# Patient Record
Sex: Female | Born: 1950
Health system: Southern US, Community
[De-identification: ages and names within clinical notes are randomized; demographics above are authoritative.]

## PROBLEM LIST (undated history)

## (undated) DIAGNOSIS — F32A Depression, unspecified: Secondary | ICD-10-CM

## (undated) DIAGNOSIS — F329 Major depressive disorder, single episode, unspecified: Secondary | ICD-10-CM

## (undated) DIAGNOSIS — G43909 Migraine, unspecified, not intractable, without status migrainosus: Secondary | ICD-10-CM

## (undated) HISTORY — PX: OTHER SURGICAL HISTORY: SHX169

## (undated) HISTORY — PX: BREAST SURGERY: SHX581

## (undated) HISTORY — DX: Depression, unspecified: F32.A

## (undated) HISTORY — DX: Major depressive disorder, single episode, unspecified: F32.9

## (undated) HISTORY — PX: FRACTURE SURGERY: SHX138

## (undated) HISTORY — PX: CHOLECYSTECTOMY: SHX55

## (undated) HISTORY — DX: Migraine, unspecified, not intractable, without status migrainosus: G43.909

## (undated) HISTORY — PX: SHOULDER SURGERY: SHX246

## (undated) HISTORY — PX: CARPAL TUNNEL RELEASE: SHX101

---

## 1998-07-16 ENCOUNTER — Encounter: Payer: Self-pay | Admitting: Emergency Medicine

## 1998-07-16 ENCOUNTER — Emergency Department (HOSPITAL_COMMUNITY): Admission: EM | Admit: 1998-07-16 | Discharge: 1998-07-16 | Payer: Self-pay | Admitting: Emergency Medicine

## 2000-01-24 ENCOUNTER — Ambulatory Visit (HOSPITAL_BASED_OUTPATIENT_CLINIC_OR_DEPARTMENT_OTHER): Admission: RE | Admit: 2000-01-24 | Discharge: 2000-01-24 | Payer: Self-pay | Admitting: Plastic Surgery

## 2000-05-29 ENCOUNTER — Ambulatory Visit (HOSPITAL_BASED_OUTPATIENT_CLINIC_OR_DEPARTMENT_OTHER): Admission: RE | Admit: 2000-05-29 | Discharge: 2000-05-29 | Payer: Self-pay | Admitting: Plastic Surgery

## 2000-12-10 ENCOUNTER — Encounter: Admission: RE | Admit: 2000-12-10 | Discharge: 2001-01-01 | Payer: Self-pay | Admitting: Internal Medicine

## 2001-01-12 ENCOUNTER — Other Ambulatory Visit: Admission: RE | Admit: 2001-01-12 | Discharge: 2001-01-12 | Payer: Self-pay | Admitting: Internal Medicine

## 2001-01-31 ENCOUNTER — Encounter: Payer: Self-pay | Admitting: Emergency Medicine

## 2001-01-31 ENCOUNTER — Emergency Department (HOSPITAL_COMMUNITY): Admission: EM | Admit: 2001-01-31 | Discharge: 2001-01-31 | Payer: Self-pay | Admitting: Emergency Medicine

## 2001-09-08 ENCOUNTER — Encounter: Admission: RE | Admit: 2001-09-08 | Discharge: 2001-12-07 | Payer: Self-pay | Admitting: Internal Medicine

## 2001-12-04 ENCOUNTER — Encounter: Payer: Self-pay | Admitting: Internal Medicine

## 2001-12-04 ENCOUNTER — Encounter: Admission: RE | Admit: 2001-12-04 | Discharge: 2001-12-04 | Payer: Self-pay | Admitting: Internal Medicine

## 2001-12-10 ENCOUNTER — Encounter: Payer: Self-pay | Admitting: Internal Medicine

## 2001-12-10 ENCOUNTER — Encounter: Admission: RE | Admit: 2001-12-10 | Discharge: 2001-12-10 | Payer: Self-pay | Admitting: Internal Medicine

## 2002-01-14 ENCOUNTER — Other Ambulatory Visit: Admission: RE | Admit: 2002-01-14 | Discharge: 2002-01-14 | Payer: Self-pay | Admitting: Internal Medicine

## 2003-12-28 ENCOUNTER — Encounter: Admission: RE | Admit: 2003-12-28 | Discharge: 2003-12-28 | Payer: Self-pay | Admitting: Internal Medicine

## 2005-10-16 ENCOUNTER — Encounter: Admission: RE | Admit: 2005-10-16 | Discharge: 2005-10-16 | Payer: Self-pay | Admitting: Obstetrics and Gynecology

## 2006-07-30 ENCOUNTER — Encounter: Admission: RE | Admit: 2006-07-30 | Discharge: 2006-07-30 | Payer: Self-pay | Admitting: Internal Medicine

## 2007-11-12 ENCOUNTER — Encounter: Admission: RE | Admit: 2007-11-12 | Discharge: 2007-11-12 | Payer: Self-pay | Admitting: Obstetrics and Gynecology

## 2008-12-06 ENCOUNTER — Encounter: Admission: RE | Admit: 2008-12-06 | Discharge: 2008-12-06 | Payer: Self-pay | Admitting: Obstetrics and Gynecology

## 2009-08-16 ENCOUNTER — Encounter: Admission: RE | Admit: 2009-08-16 | Discharge: 2009-08-16 | Payer: Self-pay | Admitting: Obstetrics and Gynecology

## 2009-11-16 ENCOUNTER — Ambulatory Visit (HOSPITAL_BASED_OUTPATIENT_CLINIC_OR_DEPARTMENT_OTHER): Admission: RE | Admit: 2009-11-16 | Discharge: 2009-11-16 | Payer: Self-pay | Admitting: Orthopedic Surgery

## 2009-12-29 ENCOUNTER — Encounter: Admission: RE | Admit: 2009-12-29 | Discharge: 2009-12-29 | Payer: Self-pay | Admitting: Obstetrics and Gynecology

## 2010-01-03 ENCOUNTER — Encounter: Admission: RE | Admit: 2010-01-03 | Discharge: 2010-01-03 | Payer: Self-pay | Admitting: Obstetrics and Gynecology

## 2010-10-19 ENCOUNTER — Ambulatory Visit
Admission: RE | Admit: 2010-10-19 | Discharge: 2010-10-19 | Payer: Self-pay | Source: Home / Self Care | Attending: Orthopedic Surgery | Admitting: Orthopedic Surgery

## 2010-10-22 LAB — POCT HEMOGLOBIN-HEMACUE: Hemoglobin: 12.5 g/dL (ref 12.0–15.0)

## 2010-10-27 ENCOUNTER — Encounter: Payer: Self-pay | Admitting: Internal Medicine

## 2010-10-28 ENCOUNTER — Encounter: Payer: Self-pay | Admitting: Obstetrics and Gynecology

## 2010-12-27 LAB — POCT HEMOGLOBIN-HEMACUE: Hemoglobin: 12.9 g/dL (ref 12.0–15.0)

## 2011-01-07 ENCOUNTER — Other Ambulatory Visit: Payer: Self-pay | Admitting: Obstetrics and Gynecology

## 2011-01-07 DIAGNOSIS — Z1231 Encounter for screening mammogram for malignant neoplasm of breast: Secondary | ICD-10-CM

## 2011-01-07 DIAGNOSIS — N951 Menopausal and female climacteric states: Secondary | ICD-10-CM

## 2011-01-07 DIAGNOSIS — M8580 Other specified disorders of bone density and structure, unspecified site: Secondary | ICD-10-CM

## 2011-01-10 ENCOUNTER — Ambulatory Visit: Payer: Self-pay

## 2011-01-11 ENCOUNTER — Inpatient Hospital Stay: Admission: RE | Admit: 2011-01-11 | Payer: Self-pay | Source: Ambulatory Visit

## 2011-01-11 ENCOUNTER — Ambulatory Visit: Payer: Self-pay

## 2011-01-18 ENCOUNTER — Ambulatory Visit
Admission: RE | Admit: 2011-01-18 | Discharge: 2011-01-18 | Disposition: A | Discharge status: Home / Self Care | Payer: 59 | Source: Ambulatory Visit | Attending: Obstetrics and Gynecology | Admitting: Obstetrics and Gynecology

## 2011-01-18 DIAGNOSIS — M8580 Other specified disorders of bone density and structure, unspecified site: Secondary | ICD-10-CM

## 2011-01-18 DIAGNOSIS — N951 Menopausal and female climacteric states: Secondary | ICD-10-CM

## 2011-01-18 DIAGNOSIS — Z1231 Encounter for screening mammogram for malignant neoplasm of breast: Secondary | ICD-10-CM

## 2011-09-26 ENCOUNTER — Other Ambulatory Visit (HOSPITAL_COMMUNITY): Payer: Self-pay | Admitting: Gastroenterology

## 2011-09-26 DIAGNOSIS — K635 Polyp of colon: Secondary | ICD-10-CM

## 2011-10-03 ENCOUNTER — Other Ambulatory Visit (HOSPITAL_COMMUNITY): Payer: 59

## 2011-10-14 ENCOUNTER — Ambulatory Visit (HOSPITAL_COMMUNITY)
Admission: RE | Admit: 2011-10-14 | Discharge: 2011-10-14 | Disposition: A | Discharge status: Home / Self Care | Payer: 59 | Source: Ambulatory Visit | Attending: Gastroenterology | Admitting: Gastroenterology

## 2011-10-14 DIAGNOSIS — K635 Polyp of colon: Secondary | ICD-10-CM

## 2011-10-14 DIAGNOSIS — D126 Benign neoplasm of colon, unspecified: Secondary | ICD-10-CM | POA: Insufficient documentation

## 2011-10-14 DIAGNOSIS — I998 Other disorder of circulatory system: Secondary | ICD-10-CM | POA: Insufficient documentation

## 2011-12-30 ENCOUNTER — Other Ambulatory Visit: Payer: Self-pay | Admitting: Obstetrics and Gynecology

## 2011-12-30 DIAGNOSIS — Z1231 Encounter for screening mammogram for malignant neoplasm of breast: Secondary | ICD-10-CM

## 2012-01-22 ENCOUNTER — Ambulatory Visit
Admission: RE | Admit: 2012-01-22 | Discharge: 2012-01-22 | Disposition: A | Discharge status: Home / Self Care | Payer: 59 | Source: Ambulatory Visit | Attending: Obstetrics and Gynecology | Admitting: Obstetrics and Gynecology

## 2012-01-22 DIAGNOSIS — Z1231 Encounter for screening mammogram for malignant neoplasm of breast: Secondary | ICD-10-CM

## 2012-01-27 ENCOUNTER — Other Ambulatory Visit: Payer: Self-pay | Admitting: Obstetrics and Gynecology

## 2012-01-27 DIAGNOSIS — R928 Other abnormal and inconclusive findings on diagnostic imaging of breast: Secondary | ICD-10-CM

## 2012-01-28 ENCOUNTER — Other Ambulatory Visit: Payer: Self-pay | Admitting: Obstetrics and Gynecology

## 2012-01-28 ENCOUNTER — Ambulatory Visit
Admission: RE | Admit: 2012-01-28 | Discharge: 2012-01-28 | Disposition: A | Discharge status: Home / Self Care | Payer: 59 | Source: Ambulatory Visit | Attending: Obstetrics and Gynecology | Admitting: Obstetrics and Gynecology

## 2012-01-28 DIAGNOSIS — R928 Other abnormal and inconclusive findings on diagnostic imaging of breast: Secondary | ICD-10-CM

## 2013-01-20 IMAGING — CR DG BE W/ CM - WO/W KUB
8 of 9 series · 8 of 9 positions shown · IV contrast (agent unspecified)
Comparison: None.

CLINICAL DATA: Incomplete colonoscopy.

BE WITH CONTRAST - WITHOUT AND WITH KUB

[view not recorded (1 of 8)]
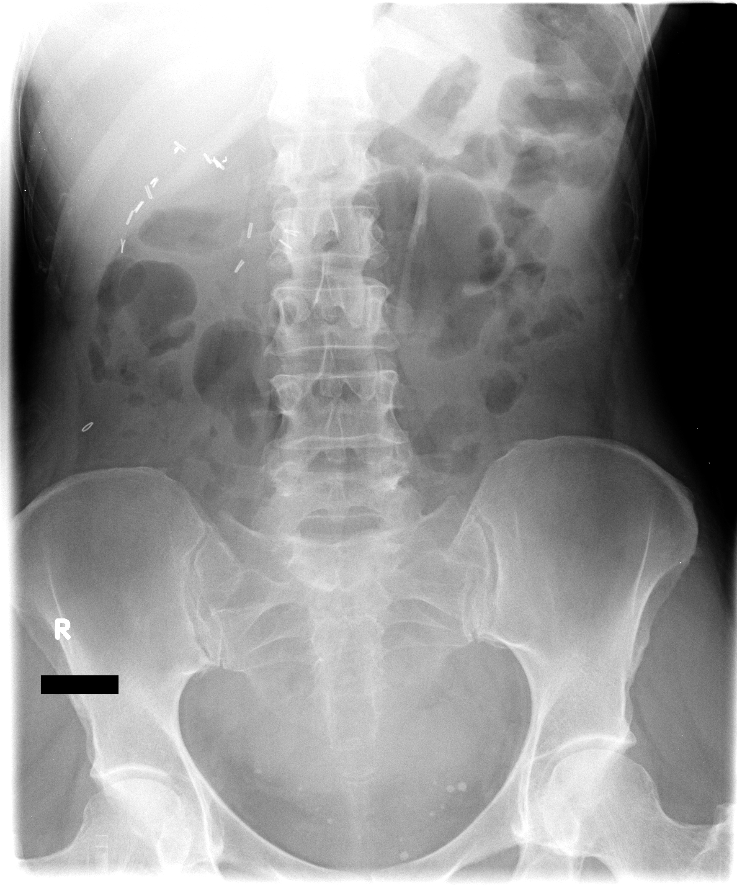

[view not recorded (2 of 8)]
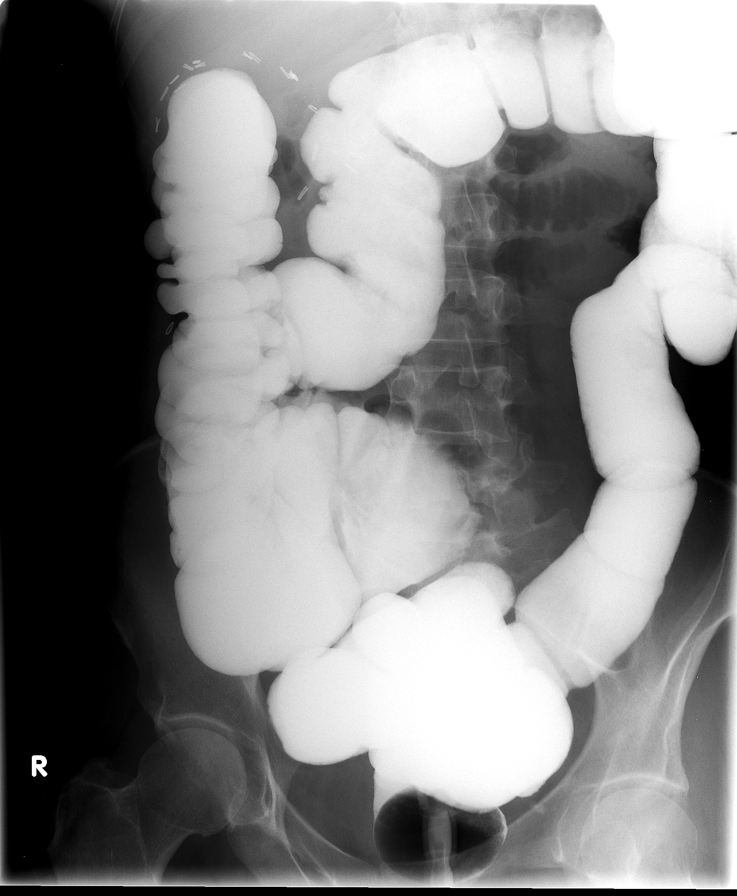

[view not recorded (3 of 8)]
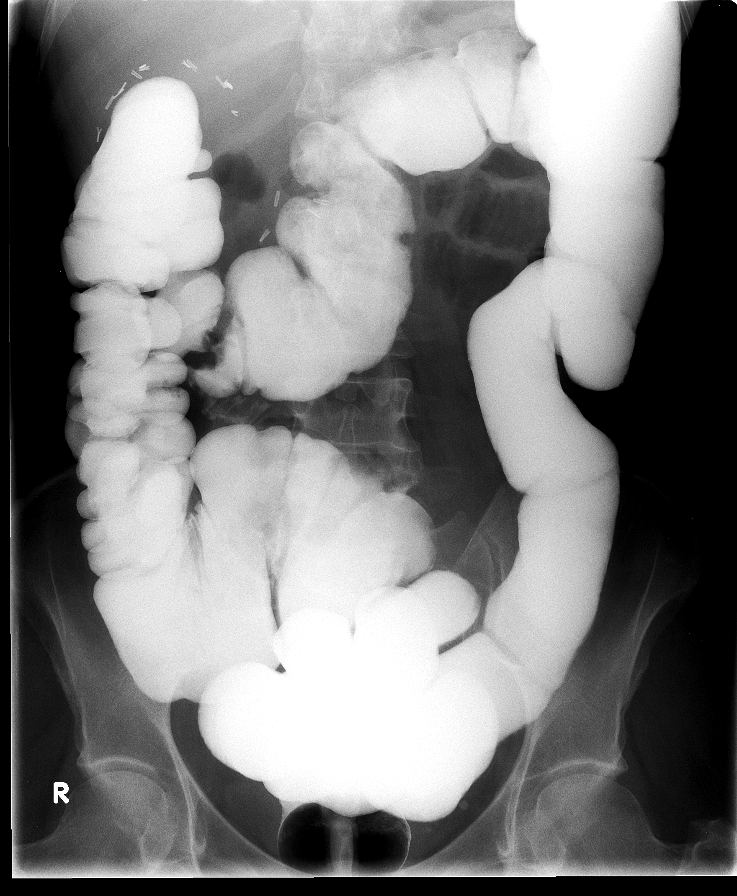

[view not recorded (4 of 8)]
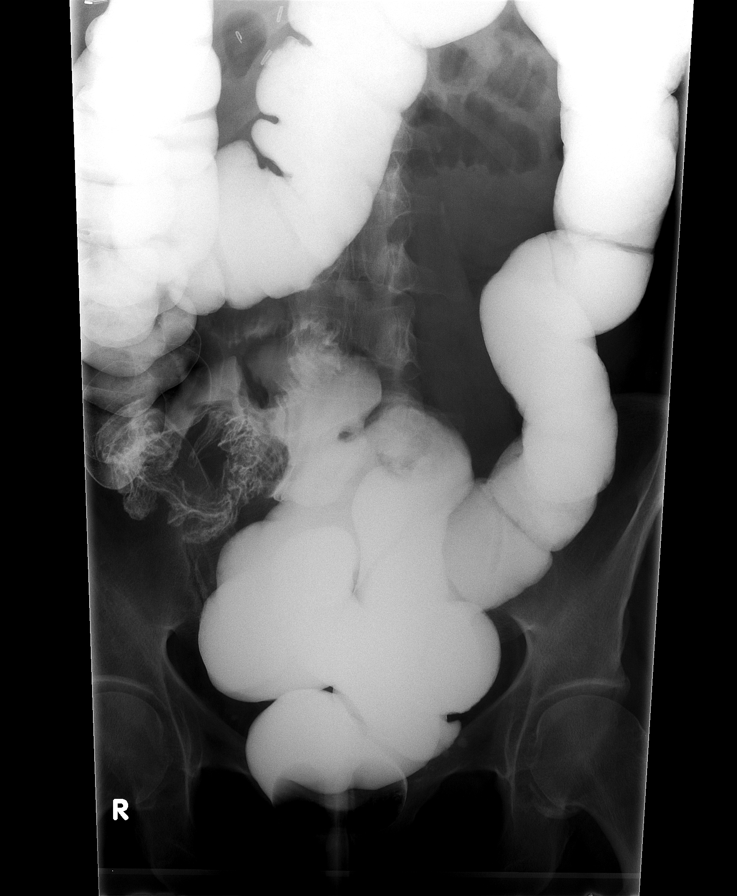

[view not recorded (5 of 8)]
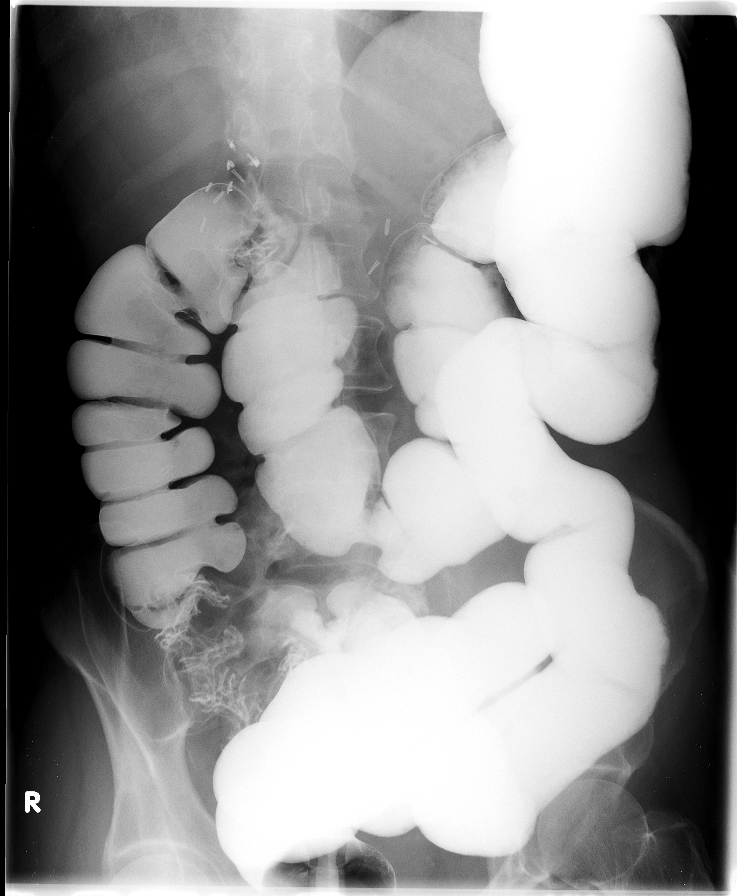

[view not recorded (6 of 8)]
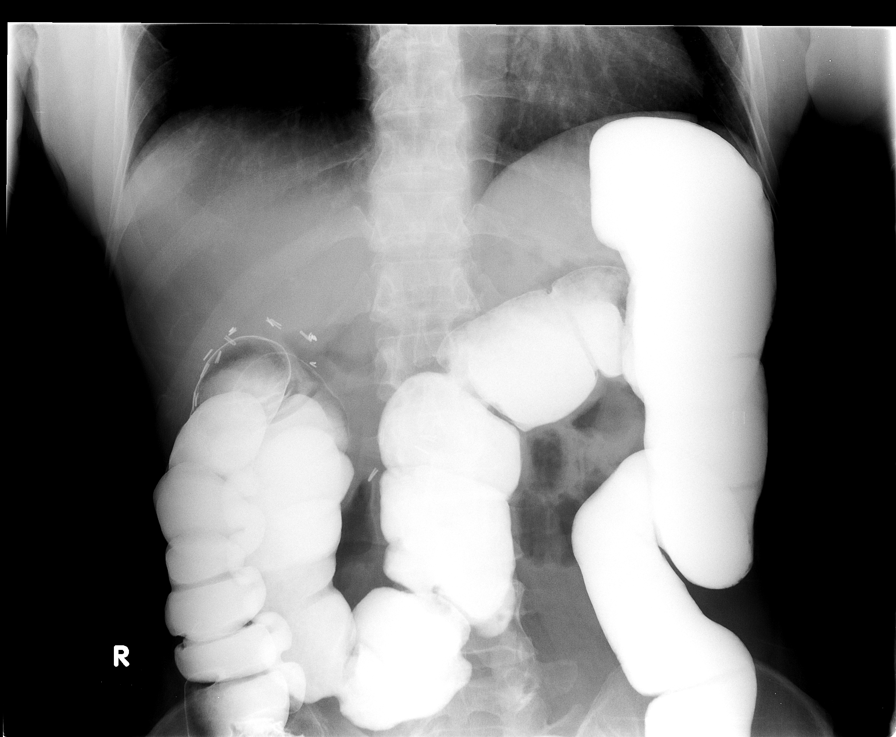

[view not recorded (7 of 8)]
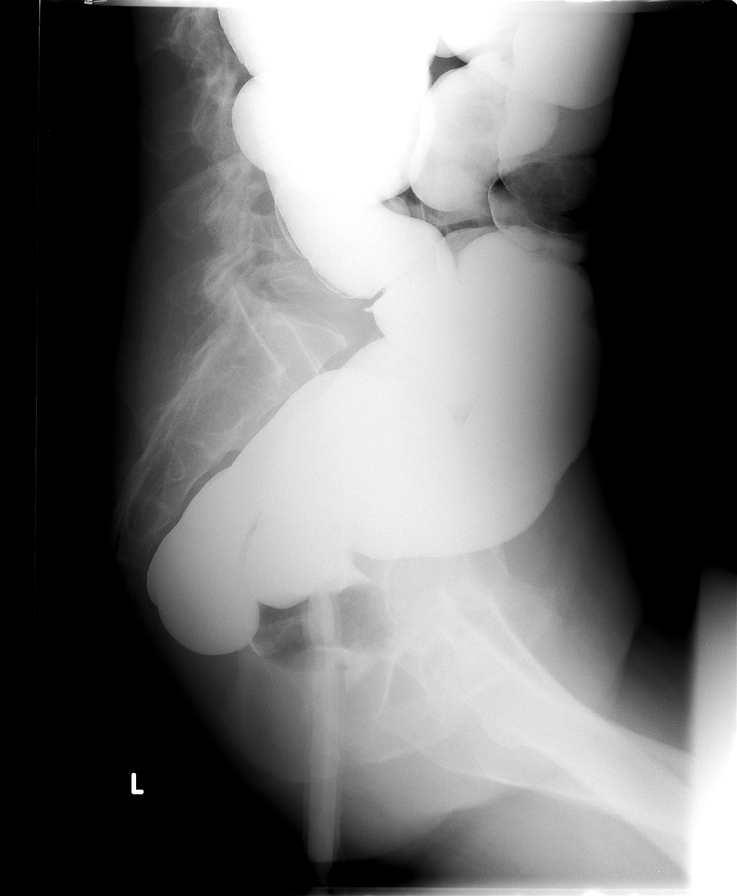

[view not recorded (8 of 8)]
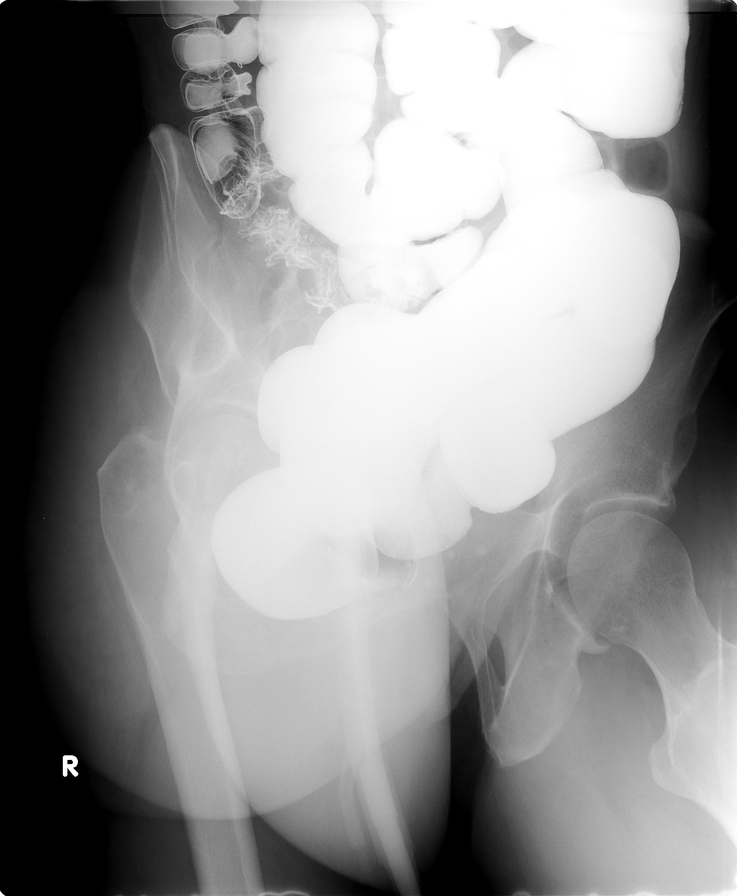

[8 of 9 positions shown; findings below may reference images not displayed]

FINDINGS: Scout film of the abdomen shows a normal bowel gas
pattern.  Clips in the right upper quadrant presumably from prior
cholecystectomy.  Calcified phleboliths in the anatomic pelvis.  No
bony abnormality.

Single contrast barium enema demonstrates no old polypoid filling
defects, fixed strictures or visible masses.
IMPRESSION: Unremarkable study.

## 2013-01-26 ENCOUNTER — Other Ambulatory Visit: Payer: Self-pay

## 2013-01-26 DIAGNOSIS — Z9882 Breast implant status: Secondary | ICD-10-CM

## 2013-01-26 DIAGNOSIS — Z1231 Encounter for screening mammogram for malignant neoplasm of breast: Secondary | ICD-10-CM

## 2013-02-23 ENCOUNTER — Ambulatory Visit
Admission: RE | Admit: 2013-02-23 | Discharge: 2013-02-23 | Disposition: A | Discharge status: Home / Self Care | Payer: 59 | Source: Ambulatory Visit

## 2013-02-23 DIAGNOSIS — Z1231 Encounter for screening mammogram for malignant neoplasm of breast: Secondary | ICD-10-CM

## 2013-02-23 DIAGNOSIS — Z9882 Breast implant status: Secondary | ICD-10-CM

## 2013-12-08 ENCOUNTER — Encounter (HOSPITAL_COMMUNITY): Payer: Self-pay | Admitting: Emergency Medicine

## 2013-12-08 ENCOUNTER — Emergency Department (HOSPITAL_COMMUNITY): Payer: Managed Care, Other (non HMO)

## 2013-12-08 ENCOUNTER — Emergency Department (HOSPITAL_COMMUNITY)
Admission: EM | Admit: 2013-12-08 | Discharge: 2013-12-08 | Disposition: A | Discharge status: Home / Self Care | Payer: Managed Care, Other (non HMO) | Attending: Emergency Medicine | Admitting: Emergency Medicine

## 2013-12-08 DIAGNOSIS — Z87891 Personal history of nicotine dependence: Secondary | ICD-10-CM | POA: Insufficient documentation

## 2013-12-08 DIAGNOSIS — Z79899 Other long term (current) drug therapy: Secondary | ICD-10-CM | POA: Insufficient documentation

## 2013-12-08 DIAGNOSIS — E860 Dehydration: Secondary | ICD-10-CM | POA: Insufficient documentation

## 2013-12-08 DIAGNOSIS — R1110 Vomiting, unspecified: Secondary | ICD-10-CM

## 2013-12-08 DIAGNOSIS — R61 Generalized hyperhidrosis: Secondary | ICD-10-CM | POA: Insufficient documentation

## 2013-12-08 DIAGNOSIS — R112 Nausea with vomiting, unspecified: Secondary | ICD-10-CM | POA: Insufficient documentation

## 2013-12-08 DIAGNOSIS — R0789 Other chest pain: Secondary | ICD-10-CM | POA: Insufficient documentation

## 2013-12-08 LAB — CBC WITH DIFFERENTIAL/PLATELET
BASOS ABS: 0 10*3/uL (ref 0.0–0.1)
BASOS PCT: 1 % (ref 0–1)
Eosinophils Absolute: 0.1 10*3/uL (ref 0.0–0.7)
Eosinophils Relative: 2 % (ref 0–5)
HEMATOCRIT: 35.8 % — AB (ref 36.0–46.0)
Hemoglobin: 12.8 g/dL (ref 12.0–15.0)
Lymphocytes Relative: 36 % (ref 12–46)
Lymphs Abs: 2 10*3/uL (ref 0.7–4.0)
MCH: 33.6 pg (ref 26.0–34.0)
MCHC: 35.8 g/dL (ref 30.0–36.0)
MCV: 94 fL (ref 78.0–100.0)
MONO ABS: 0.5 10*3/uL (ref 0.1–1.0)
Monocytes Relative: 8 % (ref 3–12)
NEUTROS ABS: 3.1 10*3/uL (ref 1.7–7.7)
Neutrophils Relative %: 54 % (ref 43–77)
Platelets: 206 10*3/uL (ref 150–400)
RBC: 3.81 MIL/uL — ABNORMAL LOW (ref 3.87–5.11)
RDW: 13.2 % (ref 11.5–15.5)
WBC: 5.7 10*3/uL (ref 4.0–10.5)

## 2013-12-08 LAB — COMPREHENSIVE METABOLIC PANEL
ALBUMIN: 4.2 g/dL (ref 3.5–5.2)
ALT: 21 U/L (ref 0–35)
AST: 47 U/L — AB (ref 0–37)
Alkaline Phosphatase: 56 U/L (ref 39–117)
BILIRUBIN TOTAL: 0.4 mg/dL (ref 0.3–1.2)
BUN: 15 mg/dL (ref 6–23)
CALCIUM: 9.7 mg/dL (ref 8.4–10.5)
CHLORIDE: 100 meq/L (ref 96–112)
CO2: 18 mEq/L — ABNORMAL LOW (ref 19–32)
CREATININE: 0.6 mg/dL (ref 0.50–1.10)
GFR calc Af Amer: >90 mL/min (ref >90)
GFR calc non Af Amer: >90 mL/min (ref >90)
Glucose, Bld: 128 mg/dL — ABNORMAL HIGH (ref 70–99)
Potassium: 4.7 mEq/L (ref 3.7–5.3)
Sodium: 138 mEq/L (ref 137–147)
TOTAL PROTEIN: 7.9 g/dL (ref 6.0–8.3)

## 2013-12-08 LAB — I-STAT TROPONIN, ED
TROPONIN I, POC: 0 ng/mL (ref 0.00–0.08)
TROPONIN I, POC: 0 ng/mL (ref 0.00–0.08)

## 2013-12-08 LAB — LIPASE, BLOOD: Lipase: 36 U/L (ref 11–59)

## 2013-12-08 LAB — ETHANOL

## 2013-12-08 MED ORDER — SODIUM CHLORIDE 0.9 % IV BOLUS (SEPSIS)
1000.0000 mL | Freq: Once | INTRAVENOUS | Status: AC
  Administered 2013-12-08: 1000 mL via INTRAVENOUS

## 2013-12-08 MED ORDER — ONDANSETRON HCL 4 MG/2ML IJ SOLN
4.0000 mg | Freq: Once | INTRAMUSCULAR | Status: AC
  Administered 2013-12-08: 4 mg via INTRAVENOUS
  Filled 2013-12-08: qty 2

## 2013-12-08 MED ORDER — LORAZEPAM 2 MG/ML IJ SOLN
1.0000 mg | Freq: Once | INTRAMUSCULAR | Status: AC
  Administered 2013-12-08: 1 mg via INTRAVENOUS
  Filled 2013-12-08: qty 1

## 2013-12-08 MED ORDER — ONDANSETRON 4 MG PO TBDP: ORAL_TABLET | ORAL | Status: DC

## 2013-12-08 NOTE — ED Notes (Signed)
Bed: WA20 Expected date:  Expected time:  Means of arrival:  Comments: ems 

## 2013-12-08 NOTE — ED Notes (Signed)
Pt from work with c/o n/v since this AM.  Actively vomiting per EMS, improved after zofran.  Skin PWD.  A+Ox4.  Zofran 4mg  IVP x2.

## 2013-12-08 NOTE — ED Notes (Signed)
To radiology

## 2013-12-08 NOTE — ED Notes (Signed)
Pt resting comfortably, no active vomiting, denies nausea at this time.  Family at bs.  NAD.

## 2013-12-08 NOTE — Discharge Instructions (Signed)
Stay hydrated.   Take zofran for nausea.   Follow up with your doctor.   Return to ER if you have severe pain, vomiting, fever, chest pain.

## 2013-12-08 NOTE — ED Notes (Signed)
Upon arrival to ED. Pt c/o of chest tightness 3/10, reported started when arrived to ED

## 2013-12-08 NOTE — ED Provider Notes (Signed)
CSN: [id number]     Arrival date & time 12/08/13  1256 History   First MD Initiated Contact with Patient 12/08/13 1257     Chief Complaint  Patient presents with  . Nausea  . Emesis  . chest tightness      (Consider location/radiation/quality/duration/timing/severity/associated sxs/prior Treatment) The history is provided by the patient.  NAKEISHA GREENHOUSE is a 63 y.o. female hx of breast surgery here with chest pain, nausea. She was on the phone this morning and started having some nausea and diaphoresis. She was given Zofran by EMS and felt slightly better but now worse. Has some chest pressure as well. Denies any fevers or chills or diarrhea. Denies history of CAD or family history of CAD. She is former smoker.    History reviewed. No pertinent past medical history. Past Surgical History  Procedure Laterality Date  . Broken leg      1970 right leg surgery  . Breast surgery      augmentation 1983  . Nose job      2012  . Cholecystectomy      1984  . Shoulder surgery      right 2000  . Carpal tunnel release      right and left wrist   History reviewed. No pertinent family history. History  Substance Use Topics  . Smoking status: Former Smoker -- 1.00 packs/day for 15 years    Types: Cigarettes  . Smokeless tobacco: Not on file  . Alcohol Use: Yes     Comment: 2-3 glasses wine daily   OB History   Grav Para Term Preterm Abortions TAB SAB Ect Mult Living                 Review of Systems  Cardiovascular: Positive for chest pain.  Gastrointestinal: Positive for vomiting.  All other systems reviewed and are negative.      Allergies  Codeine  Home Medications   Current Outpatient Rx  Name  Route  Sig  Dispense  Refill  . BIOTIN 5000 PO   Oral   Take 1 tablet by mouth daily.         . cholecalciferol (VITAMIN D) 1000 UNITS tablet   Oral   Take 1,000 Units by mouth daily.         . Escitalopram Oxalate (LEXAPRO PO)   Oral   Take 1 tablet by mouth  daily.         . Multiple Vitamin (MULTIVITAMIN WITH MINERALS) TABS tablet   Oral   Take 1 tablet by mouth daily.         . Probiotic Product (PROBIOTIC DAILY PO)   Oral   Take 2 capsules by mouth every morning.         . propranolol (INDERAL) 20 MG tablet   Oral   Take 20 mg by mouth as needed (stage fright).         . tetrahydrozoline 0.05 % ophthalmic solution   Both Eyes   Place 1 drop into both eyes 2 (two) times daily as needed (eye irritation).         . zaleplon (SONATA) 10 MG capsule   Oral   Take 10 mg by mouth at bedtime as needed for sleep.         Marland Kitchen zolmitriptan (ZOMIG) 5 MG tablet   Oral   Take 5 mg by mouth as needed for migraine.          BP 148/71  Pulse  65  Temp(Src) 97.6 F (36.4 C) (Oral)  Resp 16  SpO2 100% Physical Exam  Nursing note and vitals reviewed. Constitutional: She is oriented to person, place, and time.  Uncomfortable, vomiting   HENT:  Head: Normocephalic.  Mouth/Throat: Oropharynx is clear and moist.  Eyes: Conjunctivae are normal. Pupils are equal, round, and reactive to light.  Neck: Normal range of motion. Neck supple.  Cardiovascular: Normal rate and regular rhythm.   Pulmonary/Chest: Effort normal and breath sounds normal. No respiratory distress. She has no wheezes. She has no rales.  Abdominal: Soft. Bowel sounds are normal. She exhibits no distension. There is no tenderness. There is no rebound and no guarding.  Musculoskeletal: Normal range of motion. She exhibits no edema and no tenderness.  Neurological: She is alert and oriented to person, place, and time. No cranial nerve deficit. Coordination normal.  Skin: Skin is warm and dry.  Psychiatric: She has a normal mood and affect. Her behavior is normal. Judgment and thought content normal.    ED Course  Procedures (including critical care time) Labs Review Labs Reviewed  CBC WITH DIFFERENTIAL - Abnormal; Notable for the following:    RBC 3.81 (*)    HCT  35.8 (*)    All other components within normal limits  COMPREHENSIVE METABOLIC PANEL - Abnormal; Notable for the following:    CO2 18 (*)    Glucose, Bld 128 (*)    AST 47 (*)    All other components within normal limits  LIPASE, BLOOD  ETHANOL  I-STAT TROPOININ, ED  Randolm Idol, ED   Imaging Review Dg Chest 2 View  12/08/2013   CLINICAL DATA:  Chest pain.  EXAM: CHEST  2 VIEW  COMPARISON:  None.  FINDINGS: Lungs are clear. No pneumothorax or pleural effusion. Calcified breast implants noted. No focal bony abnormality.  IMPRESSION: No acute disease.   Electronically Signed   By: Inge Rise M.D.   On: 12/08/2013 14:16     EKG Interpretation   Date/Time:  Wednesday December 08 2013 12:57:34 EST Ventricular Rate:  63 PR Interval:  183 QRS Duration: 107 QT Interval:  488 QTC Calculation: 500 R Axis:   -29 Text Interpretation:  Sinus rhythm Borderline left axis deviation  Borderline prolonged QT interval nonspecific TW changes improved since  2011 Confirmed by Meygan Kyser  MD, Eilish Mcdaniel ([id number]) on 12/08/2013 1:25:09 PM      MDM   Final diagnoses:  None   ALVERA TOURIGNY is a 63 y.o. female here with nausea, chest pain. Consider gastritis vs atypical ACS. Will get labs, trop x 2, cxr. Will reassess.   3:21 PM CXR unremarkable. Labs showed CO2 18, likely from vomiting. Given zofran and IVF and was able to tolerate PO. Likely early gastroenteritis. Will d/c home on zofran prn.    Wandra Arthurs, MD 12/08/13 [phone]

## 2014-01-06 ENCOUNTER — Ambulatory Visit (INDEPENDENT_AMBULATORY_CARE_PROVIDER_SITE_OTHER): Payer: Managed Care, Other (non HMO) | Admitting: Family Medicine

## 2014-01-06 ENCOUNTER — Ambulatory Visit: Payer: Managed Care, Other (non HMO)

## 2014-01-06 VITALS — BP 136/82 | HR 76 | Temp 98.2°F | Resp 17 | Ht 69.5 in | Wt 146.0 lb

## 2014-01-06 DIAGNOSIS — W108XXA Fall (on) (from) other stairs and steps, initial encounter: Secondary | ICD-10-CM

## 2014-01-06 DIAGNOSIS — M25569 Pain in unspecified knee: Secondary | ICD-10-CM

## 2014-01-06 DIAGNOSIS — M25561 Pain in right knee: Secondary | ICD-10-CM

## 2014-01-06 MED ORDER — NAPROXEN 500 MG PO TBEC: 500.0000 mg | DELAYED_RELEASE_TABLET | Freq: Two times a day (BID) | ORAL | Status: DC

## 2014-01-06 NOTE — Patient Instructions (Signed)
Your xrays are normal today, which is good!  I suspect this is a sprain/strain due to your fall  Take the Naprosyn twice daily with food for the next 1-2 weeks  Ice and elevated the knee frequently over the next 48 hours  If the pain is worsening or if it is not improving in about 2 weeks, please let us know   RICE: Routine Care for Injuries The routine care of many injuries includes Rest, Ice, Compression, and Elevation (RICE). HOME CARE INSTRUCTIONS  Rest is needed to allow your body to heal. Routine activities can usually be resumed when comfortable. Injured tendons and bones can take up to 6 weeks to heal. Tendons are the cord-like structures that attach muscle to bone.  Ice following an injury helps keep the swelling down and reduces pain.  Put ice in a plastic bag.  Place a towel between your skin and the bag.  Leave the ice on for 15-20 minutes, 03-04 times a day. Do this while awake, for the first 24 to 48 hours. After that, continue as directed by your caregiver.  Compression helps keep swelling down. It also gives support and helps with discomfort. If an elastic bandage has been applied, it should be removed and reapplied every 3 to 4 hours. It should not be applied tightly, but firmly enough to keep swelling down. Watch fingers or toes for swelling, bluish discoloration, coldness, numbness, or excessive pain. If any of these problems occur, remove the bandage and reapply loosely. Contact your caregiver if these problems continue.  Elevation helps reduce swelling and decreases pain. With extremities, such as the arms, hands, legs, and feet, the injured area should be placed near or above the level of the heart, if possible. SEEK IMMEDIATE MEDICAL CARE IF:  You have persistent pain and swelling.  You develop redness, numbness, or unexpected weakness.  Your symptoms are getting worse rather than improving after several days. These symptoms may indicate that further evaluation  or further X-rays are needed. Sometimes, X-rays may not show a small broken bone (fracture) until 1 week or 10 days later. Make a follow-up appointment with your caregiver. Ask when your X-ray results will be ready. Make sure you get your X-ray results. Document Released: 01/05/2001 Document Revised: 12/16/2011 Document Reviewed: 02/22/2011 [facility] Patient Information 2014 [place], [place].

## 2014-01-06 NOTE — Progress Notes (Signed)
   Subjective:    Patient ID: Tracy Mendez, female    DOB: May 06, 1951, 63 y.o.   MRN: [id number]  HPI   Ms. Tracy Mendez is a very pleasant 63 yr old female here with concern for RIGHT knee pain.  She states she was carrying a heavy box into the house with her husband last night - she was going backwards up the steps - when she lost her balance and fell.  Unsure how she landed but subsequently developed pain in the right knee.  She feels that the knee is slightly swollen compared with the left.  No bruising.  Pain is at the anterior aspect of the knee.  Pain is worst when flexing the knee.  She is able to bear weight but does not want to flex the knee while walking.  She has not taken anything for pain thus far.  She is not iced or elevated.  She reports previous right knee pain after hiking a few years ago - saw [facility] and pain spontaneously resolved with rest.   Review of Systems  Constitutional: Negative.   Respiratory: Negative.   Cardiovascular: Negative.   Musculoskeletal: Positive for arthralgias, gait problem and joint swelling.  Skin: Negative.   Neurological: Negative for weakness and numbness.       Objective:   Physical Exam  Vitals reviewed. Constitutional: She is oriented to person, place, and time. She appears well-developed and well-nourished. No distress.  HENT:  Head: Normocephalic and atraumatic.  Cardiovascular: Intact distal pulses.   Pulmonary/Chest: Effort normal.  Musculoskeletal:       Right knee: She exhibits decreased range of motion and swelling (slight compared with the left). She exhibits no effusion, no ecchymosis, no deformity, no erythema, no LCL laxity, no bony tenderness and no MCL laxity. No medial joint line, no lateral joint line, no MCL and no LCL tenderness noted.       Legs: Pain with flexion of knee  Neurological: She is alert and oriented to person, place, and time.  Skin: Skin is warm and dry.  Psychiatric: She has a normal mood and affect.  Her behavior is normal.      [facility] reading (PRIMARY) by  Dr. Nyoka Cowden - minimal DJD at the medial joint space; no acute abnormality      Assessment & Plan:  Right anterior knee pain - Plan: DG Knee Complete 4 Views Right  Fall down steps - Plan: DG Knee Complete 4 Views Right   Ms. Tracy Mendez is a very pleasant 63 yr old female here with RIGHT knee pain after a fall last night.  Xrays are negative for fracture.  There is no tenderness to palpation on exam, but she has pain over the anterior aspect of the knee with flexion.  There is no laxity and no evidence of meniscal injury today.  Suspect sprain/strain secondary to her fall.  Will treat with Naprosyn BID x 1-2 weeks.  Encouraged frequent icing and elevation over the next 48 hours.  Placed in a knee brace for extra support.  We discussed RTC precautions including worsening symptoms or no improvement in 2 wks.  Pt to call or RTC if worsening or not improving  E. Natividad Brood MHS, PA-C [facility] 4/2/20157:23 PM

## 2014-01-07 NOTE — Progress Notes (Signed)
Xray read and patient discussed with Tracy Mendez. Agree with assessment and plan of care per her note.   

## 2014-07-22 ENCOUNTER — Other Ambulatory Visit: Payer: Self-pay

## 2015-03-17 IMAGING — CR DG CHEST 2V
2 series · 2 of 2 positions shown · non-contrast
Comparison: None.

CLINICAL DATA: Chest pain.

EXAM:
CHEST  2 VIEW

[w chest pa]
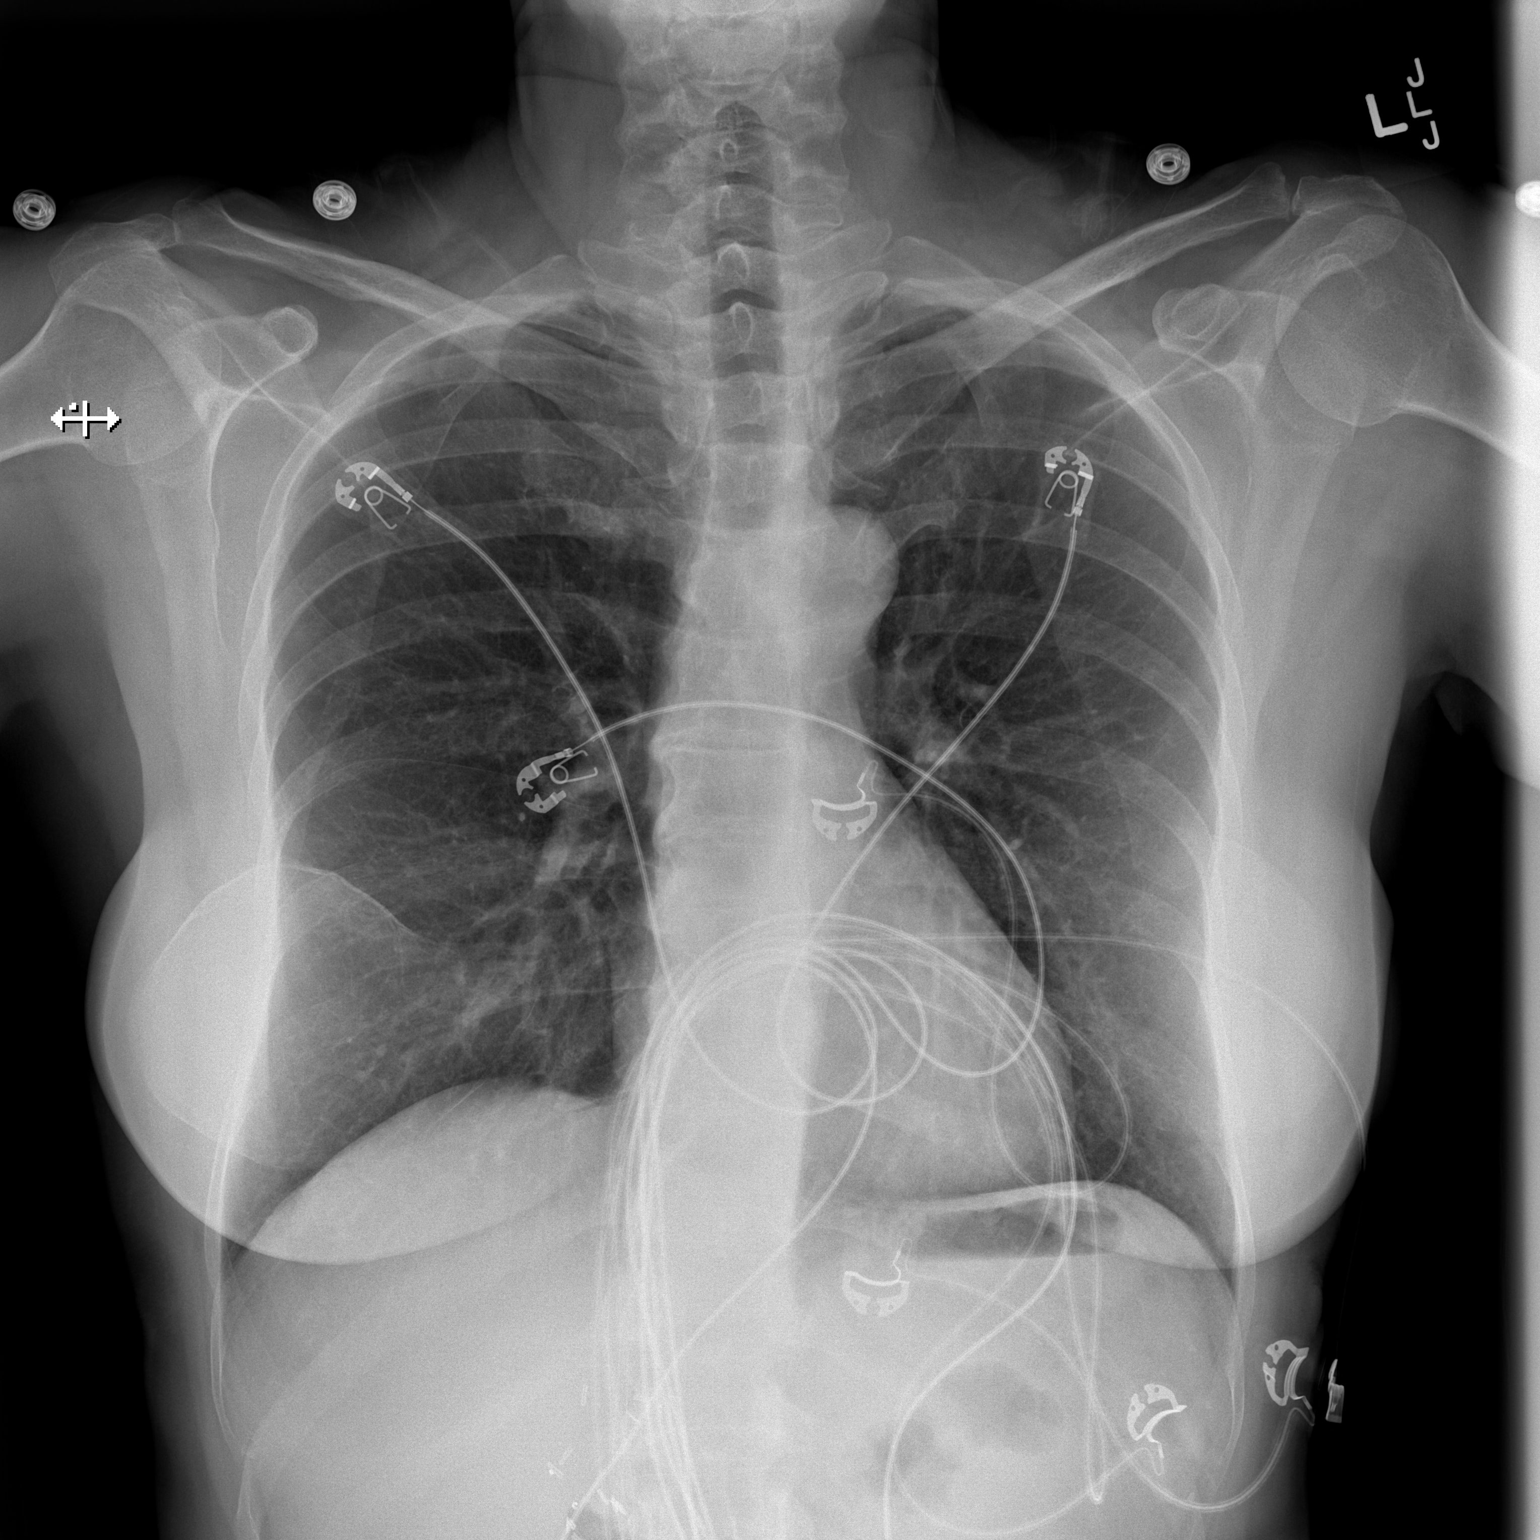

[w chest lat]
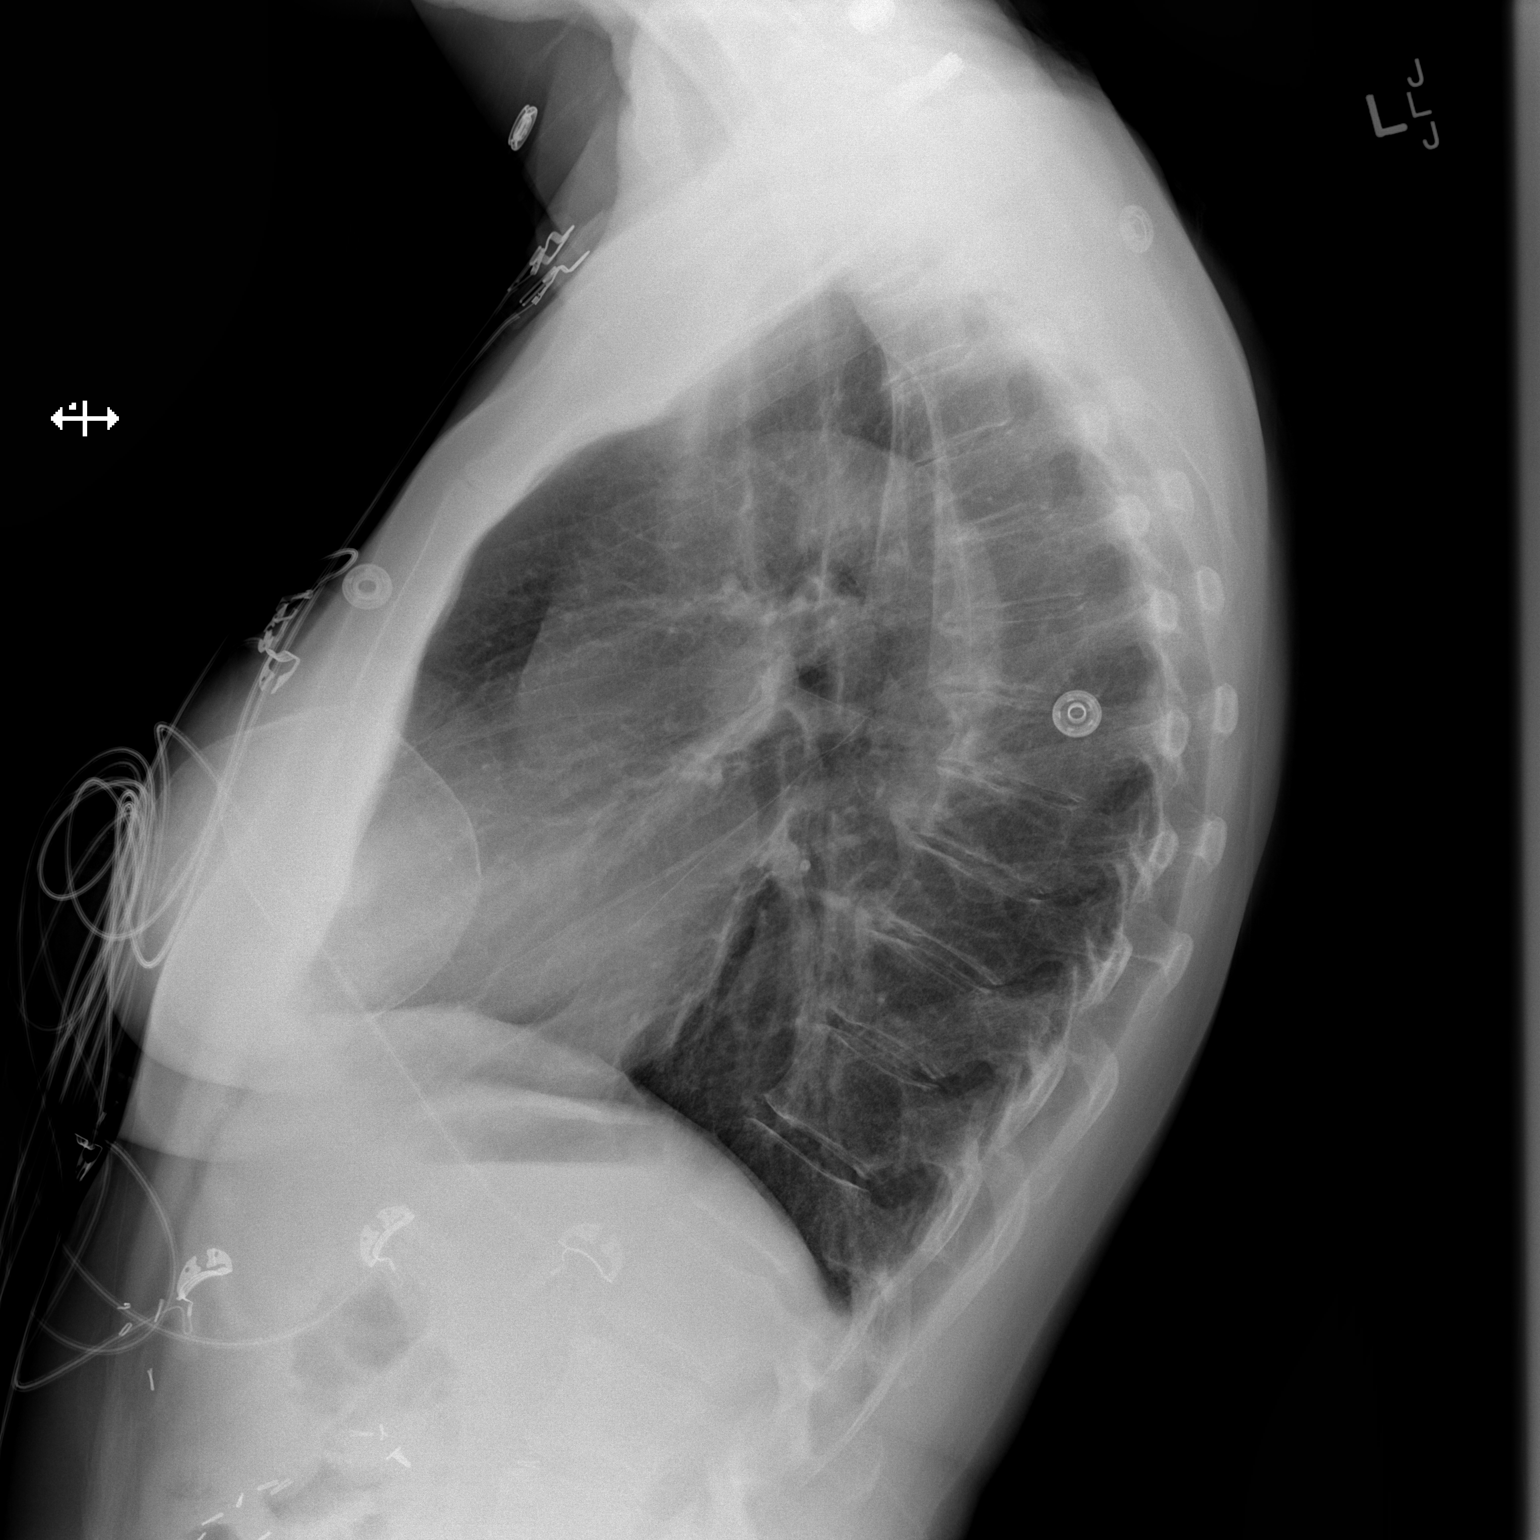

[2 of 2 positions shown; findings below may reference images not displayed]

FINDINGS: Lungs are clear. No pneumothorax or pleural effusion. Calcified
breast implants noted. No focal bony abnormality.
IMPRESSION: No acute disease.

## 2016-04-11 ENCOUNTER — Other Ambulatory Visit: Payer: Self-pay | Admitting: Obstetrics

## 2016-04-11 DIAGNOSIS — E2839 Other primary ovarian failure: Secondary | ICD-10-CM

## 2016-04-22 ENCOUNTER — Ambulatory Visit
Admission: RE | Admit: 2016-04-22 | Discharge: 2016-04-22 | Disposition: A | Discharge status: Home / Self Care | Payer: 59 | Source: Ambulatory Visit | Attending: Obstetrics | Admitting: Obstetrics

## 2016-04-22 DIAGNOSIS — E2839 Other primary ovarian failure: Secondary | ICD-10-CM

## 2016-08-01 DIAGNOSIS — Z23 Encounter for immunization: Secondary | ICD-10-CM | POA: Diagnosis not present

## 2016-08-28 DIAGNOSIS — H40023 Open angle with borderline findings, high risk, bilateral: Secondary | ICD-10-CM | POA: Diagnosis not present

## 2016-09-21 DIAGNOSIS — R69 Illness, unspecified: Secondary | ICD-10-CM | POA: Diagnosis not present

## 2016-09-21 DIAGNOSIS — Z0000 Encounter for general adult medical examination without abnormal findings: Secondary | ICD-10-CM | POA: Diagnosis not present

## 2016-09-21 DIAGNOSIS — G43919 Migraine, unspecified, intractable, without status migrainosus: Secondary | ICD-10-CM | POA: Diagnosis not present

## 2017-02-26 DIAGNOSIS — H11153 Pinguecula, bilateral: Secondary | ICD-10-CM | POA: Diagnosis not present

## 2017-02-26 DIAGNOSIS — H02401 Unspecified ptosis of right eyelid: Secondary | ICD-10-CM | POA: Diagnosis not present

## 2017-02-26 DIAGNOSIS — H2513 Age-related nuclear cataract, bilateral: Secondary | ICD-10-CM | POA: Diagnosis not present

## 2017-02-26 DIAGNOSIS — H40023 Open angle with borderline findings, high risk, bilateral: Secondary | ICD-10-CM | POA: Diagnosis not present

## 2017-05-08 DIAGNOSIS — Z85828 Personal history of other malignant neoplasm of skin: Secondary | ICD-10-CM | POA: Diagnosis not present

## 2017-05-08 DIAGNOSIS — D224 Melanocytic nevi of scalp and neck: Secondary | ICD-10-CM | POA: Diagnosis not present

## 2017-05-08 DIAGNOSIS — D225 Melanocytic nevi of trunk: Secondary | ICD-10-CM | POA: Diagnosis not present

## 2017-05-08 DIAGNOSIS — D2230 Melanocytic nevi of unspecified part of face: Secondary | ICD-10-CM | POA: Diagnosis not present

## 2017-05-08 DIAGNOSIS — L570 Actinic keratosis: Secondary | ICD-10-CM | POA: Diagnosis not present

## 2017-05-19 DIAGNOSIS — J018 Other acute sinusitis: Secondary | ICD-10-CM | POA: Diagnosis not present

## 2017-06-10 DIAGNOSIS — E559 Vitamin D deficiency, unspecified: Secondary | ICD-10-CM | POA: Diagnosis not present

## 2017-06-10 DIAGNOSIS — R69 Illness, unspecified: Secondary | ICD-10-CM | POA: Diagnosis not present

## 2017-06-10 DIAGNOSIS — G43109 Migraine with aura, not intractable, without status migrainosus: Secondary | ICD-10-CM | POA: Diagnosis not present

## 2017-06-10 DIAGNOSIS — Z79899 Other long term (current) drug therapy: Secondary | ICD-10-CM | POA: Diagnosis not present

## 2017-06-10 DIAGNOSIS — M65331 Trigger finger, right middle finger: Secondary | ICD-10-CM | POA: Diagnosis not present

## 2017-06-10 DIAGNOSIS — Z136 Encounter for screening for cardiovascular disorders: Secondary | ICD-10-CM | POA: Diagnosis not present

## 2017-06-10 DIAGNOSIS — G4700 Insomnia, unspecified: Secondary | ICD-10-CM | POA: Diagnosis not present

## 2017-06-10 DIAGNOSIS — Z0001 Encounter for general adult medical examination with abnormal findings: Secondary | ICD-10-CM | POA: Diagnosis not present

## 2017-07-10 DIAGNOSIS — F341 Dysthymic disorder: Secondary | ICD-10-CM | POA: Diagnosis not present

## 2017-07-10 DIAGNOSIS — Z23 Encounter for immunization: Secondary | ICD-10-CM | POA: Diagnosis not present

## 2017-07-10 DIAGNOSIS — F411 Generalized anxiety disorder: Secondary | ICD-10-CM | POA: Diagnosis not present

## 2017-07-10 DIAGNOSIS — R69 Illness, unspecified: Secondary | ICD-10-CM | POA: Diagnosis not present

## 2017-09-02 DIAGNOSIS — H40023 Open angle with borderline findings, high risk, bilateral: Secondary | ICD-10-CM | POA: Diagnosis not present

## 2017-09-02 DIAGNOSIS — H11153 Pinguecula, bilateral: Secondary | ICD-10-CM | POA: Diagnosis not present

## 2017-09-02 DIAGNOSIS — H2513 Age-related nuclear cataract, bilateral: Secondary | ICD-10-CM | POA: Diagnosis not present

## 2017-09-02 DIAGNOSIS — H02401 Unspecified ptosis of right eyelid: Secondary | ICD-10-CM | POA: Diagnosis not present

## 2017-09-02 DIAGNOSIS — H524 Presbyopia: Secondary | ICD-10-CM | POA: Diagnosis not present

## 2017-09-02 DIAGNOSIS — H52223 Regular astigmatism, bilateral: Secondary | ICD-10-CM | POA: Diagnosis not present

## 2018-01-01 DIAGNOSIS — R69 Illness, unspecified: Secondary | ICD-10-CM | POA: Diagnosis not present

## 2018-03-24 DIAGNOSIS — H40023 Open angle with borderline findings, high risk, bilateral: Secondary | ICD-10-CM | POA: Diagnosis not present

## 2018-03-24 DIAGNOSIS — H11823 Conjunctivochalasis, bilateral: Secondary | ICD-10-CM | POA: Diagnosis not present

## 2018-03-24 DIAGNOSIS — H2513 Age-related nuclear cataract, bilateral: Secondary | ICD-10-CM | POA: Diagnosis not present

## 2018-04-07 DIAGNOSIS — R69 Illness, unspecified: Secondary | ICD-10-CM | POA: Diagnosis not present

## 2018-05-18 DIAGNOSIS — L259 Unspecified contact dermatitis, unspecified cause: Secondary | ICD-10-CM | POA: Diagnosis not present

## 2018-05-18 DIAGNOSIS — Z85828 Personal history of other malignant neoplasm of skin: Secondary | ICD-10-CM | POA: Diagnosis not present

## 2018-05-18 DIAGNOSIS — L814 Other melanin hyperpigmentation: Secondary | ICD-10-CM | POA: Diagnosis not present

## 2018-05-18 DIAGNOSIS — D225 Melanocytic nevi of trunk: Secondary | ICD-10-CM | POA: Diagnosis not present

## 2018-05-18 DIAGNOSIS — D1801 Hemangioma of skin and subcutaneous tissue: Secondary | ICD-10-CM | POA: Diagnosis not present

## 2018-05-18 DIAGNOSIS — L821 Other seborrheic keratosis: Secondary | ICD-10-CM | POA: Diagnosis not present

## 2018-06-24 DIAGNOSIS — R69 Illness, unspecified: Secondary | ICD-10-CM | POA: Diagnosis not present

## 2018-06-24 DIAGNOSIS — Z79899 Other long term (current) drug therapy: Secondary | ICD-10-CM | POA: Diagnosis not present

## 2018-06-24 DIAGNOSIS — H6092 Unspecified otitis externa, left ear: Secondary | ICD-10-CM | POA: Diagnosis not present

## 2018-06-24 DIAGNOSIS — G4700 Insomnia, unspecified: Secondary | ICD-10-CM | POA: Diagnosis not present

## 2018-06-24 DIAGNOSIS — E559 Vitamin D deficiency, unspecified: Secondary | ICD-10-CM | POA: Diagnosis not present

## 2018-06-24 DIAGNOSIS — Z0001 Encounter for general adult medical examination with abnormal findings: Secondary | ICD-10-CM | POA: Diagnosis not present

## 2018-06-24 DIAGNOSIS — Z1389 Encounter for screening for other disorder: Secondary | ICD-10-CM | POA: Diagnosis not present

## 2018-06-24 DIAGNOSIS — G43109 Migraine with aura, not intractable, without status migrainosus: Secondary | ICD-10-CM | POA: Diagnosis not present

## 2018-06-24 DIAGNOSIS — Z23 Encounter for immunization: Secondary | ICD-10-CM | POA: Diagnosis not present

## 2018-07-01 DIAGNOSIS — Z1231 Encounter for screening mammogram for malignant neoplasm of breast: Secondary | ICD-10-CM | POA: Diagnosis not present

## 2018-07-14 DIAGNOSIS — F411 Generalized anxiety disorder: Secondary | ICD-10-CM | POA: Diagnosis not present

## 2018-07-14 DIAGNOSIS — R69 Illness, unspecified: Secondary | ICD-10-CM | POA: Diagnosis not present

## 2018-08-10 DIAGNOSIS — Z01419 Encounter for gynecological examination (general) (routine) without abnormal findings: Secondary | ICD-10-CM | POA: Diagnosis not present

## 2018-08-11 ENCOUNTER — Other Ambulatory Visit: Payer: Self-pay | Admitting: Obstetrics

## 2018-08-11 DIAGNOSIS — M8580 Other specified disorders of bone density and structure, unspecified site: Secondary | ICD-10-CM

## 2018-08-13 DIAGNOSIS — R69 Illness, unspecified: Secondary | ICD-10-CM | POA: Diagnosis not present

## 2018-09-01 DIAGNOSIS — R509 Fever, unspecified: Secondary | ICD-10-CM | POA: Diagnosis not present

## 2018-09-01 DIAGNOSIS — R05 Cough: Secondary | ICD-10-CM | POA: Diagnosis not present

## 2018-09-01 DIAGNOSIS — J209 Acute bronchitis, unspecified: Secondary | ICD-10-CM | POA: Diagnosis not present

## 2018-09-02 DIAGNOSIS — J209 Acute bronchitis, unspecified: Secondary | ICD-10-CM | POA: Diagnosis not present

## 2018-09-15 ENCOUNTER — Ambulatory Visit
Admission: RE | Admit: 2018-09-15 | Discharge: 2018-09-15 | Disposition: A | Discharge status: Home / Self Care | Payer: Medicare HMO | Source: Ambulatory Visit | Attending: Obstetrics | Admitting: Obstetrics

## 2018-09-15 DIAGNOSIS — M8589 Other specified disorders of bone density and structure, multiple sites: Secondary | ICD-10-CM | POA: Diagnosis not present

## 2018-09-15 DIAGNOSIS — Z780 Asymptomatic menopausal state: Secondary | ICD-10-CM | POA: Diagnosis not present

## 2018-09-15 DIAGNOSIS — M8580 Other specified disorders of bone density and structure, unspecified site: Secondary | ICD-10-CM

## 2018-09-24 DIAGNOSIS — J209 Acute bronchitis, unspecified: Secondary | ICD-10-CM | POA: Diagnosis not present

## 2018-10-02 ENCOUNTER — Ambulatory Visit
Admission: RE | Admit: 2018-10-02 | Discharge: 2018-10-02 | Disposition: A | Discharge status: Home / Self Care | Payer: Medicare HMO | Source: Ambulatory Visit | Attending: Internal Medicine | Admitting: Internal Medicine

## 2018-10-02 ENCOUNTER — Other Ambulatory Visit: Payer: Self-pay | Admitting: Internal Medicine

## 2018-10-02 DIAGNOSIS — R05 Cough: Secondary | ICD-10-CM

## 2018-10-02 DIAGNOSIS — R059 Cough, unspecified: Secondary | ICD-10-CM

## 2018-10-13 DIAGNOSIS — H2513 Age-related nuclear cataract, bilateral: Secondary | ICD-10-CM | POA: Diagnosis not present

## 2018-10-13 DIAGNOSIS — H02401 Unspecified ptosis of right eyelid: Secondary | ICD-10-CM | POA: Diagnosis not present

## 2018-10-13 DIAGNOSIS — H40023 Open angle with borderline findings, high risk, bilateral: Secondary | ICD-10-CM | POA: Diagnosis not present

## 2018-10-13 DIAGNOSIS — H11153 Pinguecula, bilateral: Secondary | ICD-10-CM | POA: Diagnosis not present

## 2018-11-10 DIAGNOSIS — H9202 Otalgia, left ear: Secondary | ICD-10-CM | POA: Diagnosis not present

## 2019-02-16 DIAGNOSIS — R69 Illness, unspecified: Secondary | ICD-10-CM | POA: Diagnosis not present

## 2019-05-17 DIAGNOSIS — M5030 Other cervical disc degeneration, unspecified cervical region: Secondary | ICD-10-CM | POA: Diagnosis not present

## 2019-05-17 DIAGNOSIS — M9903 Segmental and somatic dysfunction of lumbar region: Secondary | ICD-10-CM | POA: Diagnosis not present

## 2019-05-17 DIAGNOSIS — M9901 Segmental and somatic dysfunction of cervical region: Secondary | ICD-10-CM | POA: Diagnosis not present

## 2019-05-17 DIAGNOSIS — M5136 Other intervertebral disc degeneration, lumbar region: Secondary | ICD-10-CM | POA: Diagnosis not present

## 2019-05-17 DIAGNOSIS — M9904 Segmental and somatic dysfunction of sacral region: Secondary | ICD-10-CM | POA: Diagnosis not present

## 2019-05-17 DIAGNOSIS — M9902 Segmental and somatic dysfunction of thoracic region: Secondary | ICD-10-CM | POA: Diagnosis not present

## 2019-05-19 DIAGNOSIS — M5030 Other cervical disc degeneration, unspecified cervical region: Secondary | ICD-10-CM | POA: Diagnosis not present

## 2019-05-19 DIAGNOSIS — L918 Other hypertrophic disorders of the skin: Secondary | ICD-10-CM | POA: Diagnosis not present

## 2019-05-19 DIAGNOSIS — L570 Actinic keratosis: Secondary | ICD-10-CM | POA: Diagnosis not present

## 2019-05-19 DIAGNOSIS — M9904 Segmental and somatic dysfunction of sacral region: Secondary | ICD-10-CM | POA: Diagnosis not present

## 2019-05-19 DIAGNOSIS — L309 Dermatitis, unspecified: Secondary | ICD-10-CM | POA: Diagnosis not present

## 2019-05-19 DIAGNOSIS — M9902 Segmental and somatic dysfunction of thoracic region: Secondary | ICD-10-CM | POA: Diagnosis not present

## 2019-05-19 DIAGNOSIS — M9901 Segmental and somatic dysfunction of cervical region: Secondary | ICD-10-CM | POA: Diagnosis not present

## 2019-05-19 DIAGNOSIS — M5136 Other intervertebral disc degeneration, lumbar region: Secondary | ICD-10-CM | POA: Diagnosis not present

## 2019-05-19 DIAGNOSIS — D224 Melanocytic nevi of scalp and neck: Secondary | ICD-10-CM | POA: Diagnosis not present

## 2019-05-19 DIAGNOSIS — Z85828 Personal history of other malignant neoplasm of skin: Secondary | ICD-10-CM | POA: Diagnosis not present

## 2019-05-19 DIAGNOSIS — D2230 Melanocytic nevi of unspecified part of face: Secondary | ICD-10-CM | POA: Diagnosis not present

## 2019-05-19 DIAGNOSIS — D225 Melanocytic nevi of trunk: Secondary | ICD-10-CM | POA: Diagnosis not present

## 2019-05-19 DIAGNOSIS — M9903 Segmental and somatic dysfunction of lumbar region: Secondary | ICD-10-CM | POA: Diagnosis not present

## 2019-05-26 DIAGNOSIS — M9901 Segmental and somatic dysfunction of cervical region: Secondary | ICD-10-CM | POA: Diagnosis not present

## 2019-05-26 DIAGNOSIS — M9902 Segmental and somatic dysfunction of thoracic region: Secondary | ICD-10-CM | POA: Diagnosis not present

## 2019-05-26 DIAGNOSIS — M9903 Segmental and somatic dysfunction of lumbar region: Secondary | ICD-10-CM | POA: Diagnosis not present

## 2019-05-26 DIAGNOSIS — M5030 Other cervical disc degeneration, unspecified cervical region: Secondary | ICD-10-CM | POA: Diagnosis not present

## 2019-05-26 DIAGNOSIS — M5136 Other intervertebral disc degeneration, lumbar region: Secondary | ICD-10-CM | POA: Diagnosis not present

## 2019-05-26 DIAGNOSIS — M9904 Segmental and somatic dysfunction of sacral region: Secondary | ICD-10-CM | POA: Diagnosis not present

## 2019-05-31 DIAGNOSIS — M9901 Segmental and somatic dysfunction of cervical region: Secondary | ICD-10-CM | POA: Diagnosis not present

## 2019-05-31 DIAGNOSIS — M5136 Other intervertebral disc degeneration, lumbar region: Secondary | ICD-10-CM | POA: Diagnosis not present

## 2019-05-31 DIAGNOSIS — M9904 Segmental and somatic dysfunction of sacral region: Secondary | ICD-10-CM | POA: Diagnosis not present

## 2019-05-31 DIAGNOSIS — M5030 Other cervical disc degeneration, unspecified cervical region: Secondary | ICD-10-CM | POA: Diagnosis not present

## 2019-05-31 DIAGNOSIS — M9902 Segmental and somatic dysfunction of thoracic region: Secondary | ICD-10-CM | POA: Diagnosis not present

## 2019-05-31 DIAGNOSIS — M9903 Segmental and somatic dysfunction of lumbar region: Secondary | ICD-10-CM | POA: Diagnosis not present

## 2019-06-07 DIAGNOSIS — M5136 Other intervertebral disc degeneration, lumbar region: Secondary | ICD-10-CM | POA: Diagnosis not present

## 2019-06-07 DIAGNOSIS — M9902 Segmental and somatic dysfunction of thoracic region: Secondary | ICD-10-CM | POA: Diagnosis not present

## 2019-06-07 DIAGNOSIS — M9901 Segmental and somatic dysfunction of cervical region: Secondary | ICD-10-CM | POA: Diagnosis not present

## 2019-06-07 DIAGNOSIS — M9903 Segmental and somatic dysfunction of lumbar region: Secondary | ICD-10-CM | POA: Diagnosis not present

## 2019-06-07 DIAGNOSIS — M5030 Other cervical disc degeneration, unspecified cervical region: Secondary | ICD-10-CM | POA: Diagnosis not present

## 2019-06-07 DIAGNOSIS — M9904 Segmental and somatic dysfunction of sacral region: Secondary | ICD-10-CM | POA: Diagnosis not present

## 2019-06-10 DIAGNOSIS — R69 Illness, unspecified: Secondary | ICD-10-CM | POA: Diagnosis not present

## 2019-06-21 DIAGNOSIS — M9904 Segmental and somatic dysfunction of sacral region: Secondary | ICD-10-CM | POA: Diagnosis not present

## 2019-06-21 DIAGNOSIS — M5030 Other cervical disc degeneration, unspecified cervical region: Secondary | ICD-10-CM | POA: Diagnosis not present

## 2019-06-21 DIAGNOSIS — M9901 Segmental and somatic dysfunction of cervical region: Secondary | ICD-10-CM | POA: Diagnosis not present

## 2019-06-21 DIAGNOSIS — M5136 Other intervertebral disc degeneration, lumbar region: Secondary | ICD-10-CM | POA: Diagnosis not present

## 2019-06-21 DIAGNOSIS — M9902 Segmental and somatic dysfunction of thoracic region: Secondary | ICD-10-CM | POA: Diagnosis not present

## 2019-06-21 DIAGNOSIS — M9903 Segmental and somatic dysfunction of lumbar region: Secondary | ICD-10-CM | POA: Diagnosis not present

## 2019-07-07 DIAGNOSIS — M5030 Other cervical disc degeneration, unspecified cervical region: Secondary | ICD-10-CM | POA: Diagnosis not present

## 2019-07-07 DIAGNOSIS — Z1231 Encounter for screening mammogram for malignant neoplasm of breast: Secondary | ICD-10-CM | POA: Diagnosis not present

## 2019-07-07 DIAGNOSIS — M5136 Other intervertebral disc degeneration, lumbar region: Secondary | ICD-10-CM | POA: Diagnosis not present

## 2019-07-07 DIAGNOSIS — M9901 Segmental and somatic dysfunction of cervical region: Secondary | ICD-10-CM | POA: Diagnosis not present

## 2019-07-07 DIAGNOSIS — M9903 Segmental and somatic dysfunction of lumbar region: Secondary | ICD-10-CM | POA: Diagnosis not present

## 2019-07-07 DIAGNOSIS — M9902 Segmental and somatic dysfunction of thoracic region: Secondary | ICD-10-CM | POA: Diagnosis not present

## 2019-07-13 DIAGNOSIS — R69 Illness, unspecified: Secondary | ICD-10-CM | POA: Diagnosis not present

## 2019-08-04 DIAGNOSIS — H02834 Dermatochalasis of left upper eyelid: Secondary | ICD-10-CM | POA: Diagnosis not present

## 2019-08-04 DIAGNOSIS — H02421 Myogenic ptosis of right eyelid: Secondary | ICD-10-CM | POA: Diagnosis not present

## 2019-08-04 DIAGNOSIS — H02831 Dermatochalasis of right upper eyelid: Secondary | ICD-10-CM | POA: Diagnosis not present

## 2019-08-04 DIAGNOSIS — H019 Unspecified inflammation of eyelid: Secondary | ICD-10-CM | POA: Diagnosis not present

## 2019-08-06 DIAGNOSIS — M5030 Other cervical disc degeneration, unspecified cervical region: Secondary | ICD-10-CM | POA: Diagnosis not present

## 2019-08-06 DIAGNOSIS — M9903 Segmental and somatic dysfunction of lumbar region: Secondary | ICD-10-CM | POA: Diagnosis not present

## 2019-08-06 DIAGNOSIS — M9901 Segmental and somatic dysfunction of cervical region: Secondary | ICD-10-CM | POA: Diagnosis not present

## 2019-08-06 DIAGNOSIS — M9902 Segmental and somatic dysfunction of thoracic region: Secondary | ICD-10-CM | POA: Diagnosis not present

## 2019-08-06 DIAGNOSIS — M5136 Other intervertebral disc degeneration, lumbar region: Secondary | ICD-10-CM | POA: Diagnosis not present

## 2019-09-01 DIAGNOSIS — M5136 Other intervertebral disc degeneration, lumbar region: Secondary | ICD-10-CM | POA: Diagnosis not present

## 2019-09-01 DIAGNOSIS — M9903 Segmental and somatic dysfunction of lumbar region: Secondary | ICD-10-CM | POA: Diagnosis not present

## 2019-09-01 DIAGNOSIS — M9901 Segmental and somatic dysfunction of cervical region: Secondary | ICD-10-CM | POA: Diagnosis not present

## 2019-09-01 DIAGNOSIS — M9904 Segmental and somatic dysfunction of sacral region: Secondary | ICD-10-CM | POA: Diagnosis not present

## 2019-09-01 DIAGNOSIS — M9902 Segmental and somatic dysfunction of thoracic region: Secondary | ICD-10-CM | POA: Diagnosis not present

## 2019-09-14 DIAGNOSIS — H02421 Myogenic ptosis of right eyelid: Secondary | ICD-10-CM | POA: Diagnosis not present

## 2019-09-27 DIAGNOSIS — M5136 Other intervertebral disc degeneration, lumbar region: Secondary | ICD-10-CM | POA: Diagnosis not present

## 2019-09-27 DIAGNOSIS — M9903 Segmental and somatic dysfunction of lumbar region: Secondary | ICD-10-CM | POA: Diagnosis not present

## 2019-09-27 DIAGNOSIS — M9904 Segmental and somatic dysfunction of sacral region: Secondary | ICD-10-CM | POA: Diagnosis not present

## 2019-09-27 DIAGNOSIS — M9901 Segmental and somatic dysfunction of cervical region: Secondary | ICD-10-CM | POA: Diagnosis not present

## 2019-09-27 DIAGNOSIS — M9902 Segmental and somatic dysfunction of thoracic region: Secondary | ICD-10-CM | POA: Diagnosis not present

## 2019-10-20 ENCOUNTER — Ambulatory Visit (INDEPENDENT_AMBULATORY_CARE_PROVIDER_SITE_OTHER): Payer: Medicare HMO | Admitting: Sports Medicine

## 2019-10-20 ENCOUNTER — Other Ambulatory Visit: Payer: Self-pay

## 2019-10-20 ENCOUNTER — Encounter: Payer: Self-pay | Admitting: Sports Medicine

## 2019-10-20 DIAGNOSIS — M722 Plantar fascial fibromatosis: Secondary | ICD-10-CM

## 2019-10-20 MED ORDER — MELOXICAM 15 MG PO TABS: ORAL_TABLET | ORAL | 3 refills | Status: DC

## 2019-10-20 NOTE — Progress Notes (Addendum)
    Procedures performed today:    None.  Independent interpretation of tests performed by another provider:   None.  Impression and Recommendations:    Plantar fasciitis, left For some time Coleen has noted pain in her heel, worse with the first few steps in the morning and particularly bad during long hikes. We are going to start conservatively with meloxicam, referral for custom orthotics, rehab exercises. Dorsal splint at night, air heel brace during the day. (We did not have a medium, we will need to wait for a rep to return for a medium air heel brace.) Return to see me in 1 month, injection if no better.    ___________________________________________ Gwen Her. Dianah Field, M.D., ABFM., CAQSM. Primary Care and [place] Instructor of [place] [facility] Medicine

## 2019-10-20 NOTE — Assessment & Plan Note (Addendum)
For some time Bryndle has noted pain in her heel, worse with the first few steps in the morning and particularly bad during long hikes. We are going to start conservatively with meloxicam, referral for custom orthotics, rehab exercises. Dorsal splint at night, air heel brace during the day. (We did not have a medium, we will need to wait for a rep to return for a medium air heel brace.) Return to see me in 1 month, injection if no better.

## 2019-10-26 DIAGNOSIS — H524 Presbyopia: Secondary | ICD-10-CM | POA: Diagnosis not present

## 2019-10-26 DIAGNOSIS — H5211 Myopia, right eye: Secondary | ICD-10-CM | POA: Diagnosis not present

## 2019-10-26 DIAGNOSIS — H5202 Hypermetropia, left eye: Secondary | ICD-10-CM | POA: Diagnosis not present

## 2019-10-26 DIAGNOSIS — H52221 Regular astigmatism, right eye: Secondary | ICD-10-CM | POA: Diagnosis not present

## 2019-10-27 ENCOUNTER — Ambulatory Visit: Payer: Medicare Other | Attending: Internal Medicine

## 2019-10-27 ENCOUNTER — Other Ambulatory Visit: Payer: Self-pay

## 2019-10-27 DIAGNOSIS — Z23 Encounter for immunization: Secondary | ICD-10-CM

## 2019-10-27 NOTE — Progress Notes (Signed)
   Covid-19 Vaccination Clinic  Name:  Tracy Mendez    MRN: [id number] DOB: 01/11/1951  10/27/2019  [name] was observed post Covid-19 immunization for 15 minutes without incidence. She was provided with Vaccine Information Sheet and instruction to access the V-Safe system.   [name] was instructed to call 911 with any severe reactions post vaccine: Marland Kitchen Difficulty breathing  . Swelling of your face and throat  . A fast heartbeat  . A bad rash all over your body  . Dizziness and weakness    Immunizations Administered    Name Date Dose VIS Date Route   Pfizer COVID-19 Vaccine 10/27/2019 12:16 PM 0.3 mL 09/17/2019 Intramuscular   Manufacturer: [place]   Lot: [id number]   [place]: [id number]

## 2019-10-28 DIAGNOSIS — M722 Plantar fascial fibromatosis: Secondary | ICD-10-CM | POA: Diagnosis not present

## 2019-11-02 ENCOUNTER — Encounter: Payer: Self-pay | Admitting: Family Medicine

## 2019-11-02 ENCOUNTER — Ambulatory Visit: Payer: Medicare HMO | Admitting: Family Medicine

## 2019-11-02 ENCOUNTER — Other Ambulatory Visit: Payer: Self-pay

## 2019-11-02 DIAGNOSIS — M722 Plantar fascial fibromatosis: Secondary | ICD-10-CM

## 2019-11-02 DIAGNOSIS — R69 Illness, unspecified: Secondary | ICD-10-CM | POA: Diagnosis not present

## 2019-11-02 NOTE — Progress Notes (Signed)
Tracy Mendez - 69 y.o. female MRN [id number]  Date of birth: 1951/04/23  SUBJECTIVE:  Including CC & ROS.  Chief Complaint  Patient presents with  . Foot Orthotics    Tracy Mendez is a 69 y.o. female that is has a history of plantar fasciitis on the left foot.  She hikes her phone.  She notices the pain when she hikes from time to time.  Seems to be doing better today with the adjustments she is made recently..   Review of Systems See HPI   HISTORY: Past Medical, Surgical, Social, and Family History Reviewed & Updated per EMR.   Pertinent Historical Findings include:  Past Medical History:  Diagnosis Date  . Depression   . Migraine     Past Surgical History:  Procedure Laterality Date  . BREAST SURGERY     augmentation 1983  . broken leg     1970 right leg surgery  . CARPAL TUNNEL RELEASE     right and left wrist  . CHOLECYSTECTOMY     1984  . FRACTURE SURGERY    . nose job     2012  . SHOULDER SURGERY     right 2000    Allergies  Allergen Reactions  . Codeine Other (See Comments)    "makes me feel like I'm having a heart attack"    Family History  Problem Relation Age of Onset  . Stroke Mother      Social History   Socioeconomic History  . Marital status: Married    Spouse name: Not on file  . Number of children: Not on file  . Years of education: Not on file  . Highest education level: Not on file  Occupational History  . Not on file  Tobacco Use  . Smoking status: Former Smoker    Packs/day: 1.00    Years: 15.00    Pack years: 15.00    Types: Cigarettes  . Smokeless tobacco: Never Used  Substance and Sexual Activity  . Alcohol use: Yes    Comment: 2-3 glasses wine daily  . Drug use: No  . Sexual activity: Never    Birth control/protection: Abstinence  Other Topics Concern  . Not on file  Social History Narrative  . Not on file   Social Determinants of Health   Financial Resource Strain:   . Difficulty of Paying Living  Expenses: Not on file  Food Insecurity:   . Worried About Charity fundraiser in the Last Year: Not on file  . Ran Out of Food in the Last Year: Not on file  Transportation Needs:   . Lack of Transportation (Medical): Not on file  . Lack of Transportation (Non-Medical): Not on file  Physical Activity:   . Days of Exercise per Week: Not on file  . Minutes of Exercise per Session: Not on file  Stress:   . Feeling of Stress : Not on file  Social Connections:   . Frequency of Communication with Friends and Family: Not on file  . Frequency of Social Gatherings with Friends and Family: Not on file  . Attends Religious Services: Not on file  . Active Member of Clubs or Organizations: Not on file  . Attends Archivist Meetings: Not on file  . Marital Status: Not on file  Intimate Partner Violence:   . Fear of Current or Ex-Partner: Not on file  . Emotionally Abused: Not on file  . Physically Abused: Not on file  .  Sexually Abused: Not on file     PHYSICAL EXAM:  VS: BP 129/75   Pulse 66   Ht 5\' 8"  (1.727 m)   Wt 145 lb (65.8 kg)   BMI 22.05 kg/m  Physical Exam Gen: NAD, alert, cooperative with exam, well-appearing ENT: normal lips, normal nasal mucosa,  Eye: normal EOM, normal conjunctiva and lids Skin: no rashes, no areas of induration  Neuro: normal tone, normal sensation to touch Psych:  normal insight, alert and oriented MSK:  Left foot: No obvious swelling or ecchymosis. Mild hallux valgus. Neurovascularly intact  Patient was fitted for a dress insole. The orthotic was heated and afterward the patient stood on the orthotic blank positioned on the orthotic stand. The patient was positioned in subtalar neutral position and 10 degrees of ankle dorsiflexion in a weight bearing stance. After completion of molding, a stable base was applied to the orthotic blank. The blank was ground to a stable position for weight bearing. Size: 9 Base: Blue EVA Additional  Posting and Padding: None The patient ambulated these, and they were very comfortable.  Patient was fitted for a standard, cushioned, semi-rigid orthotic. The orthotic was heated and afterward the patient stood on the orthotic blank positioned on the orthotic stand. The patient was positioned in subtalar neutral position and 10 degrees of ankle dorsiflexion in a weight bearing stance. After completion of molding, a stable base was applied to the orthotic blank. The blank was ground to a stable position for weight bearing. Size: 9 Base: Blue EVA Additional Posting and Padding: None The patient ambulated these, and they were very comfortable.   ASSESSMENT & PLAN:   Plantar fasciitis, left Pain has improved recently.  Hikes for fun -Made a pair of dress insoles.  Could consider adding Poron to the bottom if needing any cushion. -Regular custom orthotics made as well.

## 2019-11-02 NOTE — Assessment & Plan Note (Signed)
Pain has improved recently.  Hikes for fun -Made a pair of dress insoles.  Could consider adding Poron to the bottom if needing any cushion. -Regular custom orthotics made as well.

## 2019-11-08 DIAGNOSIS — H02421 Myogenic ptosis of right eyelid: Secondary | ICD-10-CM | POA: Diagnosis not present

## 2019-11-08 DIAGNOSIS — H02423 Myogenic ptosis of bilateral eyelids: Secondary | ICD-10-CM | POA: Diagnosis not present

## 2019-11-16 DIAGNOSIS — Z23 Encounter for immunization: Secondary | ICD-10-CM | POA: Diagnosis not present

## 2019-11-16 DIAGNOSIS — Z0000 Encounter for general adult medical examination without abnormal findings: Secondary | ICD-10-CM | POA: Diagnosis not present

## 2019-11-16 DIAGNOSIS — Z79899 Other long term (current) drug therapy: Secondary | ICD-10-CM | POA: Diagnosis not present

## 2019-11-16 DIAGNOSIS — E559 Vitamin D deficiency, unspecified: Secondary | ICD-10-CM | POA: Diagnosis not present

## 2019-11-16 DIAGNOSIS — G43109 Migraine with aura, not intractable, without status migrainosus: Secondary | ICD-10-CM | POA: Diagnosis not present

## 2019-11-16 DIAGNOSIS — R69 Illness, unspecified: Secondary | ICD-10-CM | POA: Diagnosis not present

## 2019-11-17 ENCOUNTER — Ambulatory Visit (INDEPENDENT_AMBULATORY_CARE_PROVIDER_SITE_OTHER): Payer: Medicare HMO | Admitting: Sports Medicine

## 2019-11-17 DIAGNOSIS — M722 Plantar fascial fibromatosis: Secondary | ICD-10-CM | POA: Diagnosis not present

## 2019-11-17 NOTE — Assessment & Plan Note (Signed)
Astra is touching base with me again virtually, we treated her conservatively for left plantar fasciitis, meloxicam, and air heel brace, she did not really tolerate the night splint. We also referred her for custom orthotics, as are going well, she has hiked 50 miles over the last week, happy with how things are going, she can return to see me as needed.

## 2019-11-17 NOTE — Progress Notes (Signed)
   Virtual Visit via WebEx/MyChart   I connected with  Tracy Mendez  on 11/17/19 via WebEx/MyChart/Doximity Video and verified that I am speaking with the correct person using two identifiers.   I discussed the limitations, risks, security and privacy concerns of performing an evaluation and management service by WebEx/MyChart/Doximity Video, including the higher likelihood of inaccurate diagnosis and treatment, and the availability of in person appointments.  We also discussed the likely need of an additional face to face encounter for complete and high quality delivery of care.  I also discussed with the patient that there may be a patient responsible charge related to this service. The patient expressed understanding and wishes to proceed.  Provider location is either at home or medical facility. Patient location is at their home, different from provider location. People involved in care of the patient during this telehealth encounter were myself, my nurse/medical assistant, and my front office/scheduling team member.  Review of Systems: No fevers, chills, night sweats, weight loss, chest pain, or shortness of breath.   Objective Findings:    General: Speaking full sentences, no audible heavy breathing.  Sounds alert and appropriately interactive.  Appears well.  Face symmetric.  Extraocular movements intact.  Pupils equal and round.  No nasal flaring or accessory muscle use visualized.  Independent interpretation of tests performed by another provider:   None.  Impression and Recommendations:    Plantar fasciitis, left Tracy Mendez is touching base with me again virtually, we treated her conservatively for left plantar fasciitis, meloxicam, and air heel brace, she did not really tolerate the night splint. We also referred her for custom orthotics, as are going well, she has hiked 50 miles over the last week, happy with how things are going, she can return to see me as needed.   I  discussed the above assessment and treatment plan with the patient. The patient was provided an opportunity to ask questions and all were answered. The patient agreed with the plan and demonstrated an understanding of the instructions.   The patient was advised to call back or seek an in-person evaluation if the symptoms worsen or if the condition fails to improve as anticipated.   I provided 30 minutes of face to face and non-face-to-face time during this encounter date, time was needed to gather information, review chart, records, communicate/coordinate with staff remotely, as well as complete documentation.   ___________________________________________ Gwen Her. Dianah Field, M.D., ABFM., CAQSM. Primary Care and [place] Instructor of [place] [facility] of Medicine

## 2019-11-18 ENCOUNTER — Ambulatory Visit: Payer: Medicare HMO | Attending: Internal Medicine

## 2019-11-18 DIAGNOSIS — Z23 Encounter for immunization: Secondary | ICD-10-CM

## 2019-11-18 NOTE — Progress Notes (Signed)
   Covid-19 Vaccination Clinic  Name:  Tracy Mendez    MRN: [id number] DOB: [birth year]  11/18/2019  [name] was observed post Covid-19 immunization for 15 minutes without incidence. She was provided with Vaccine Information Sheet and instruction to access the V-Safe system.   [name] was instructed to call 911 with any severe reactions post vaccine: Marland Kitchen Difficulty breathing  . Swelling of your face and throat  . A fast heartbeat  . A bad rash all over your body  . Dizziness and weakness    Immunizations Administered    Name Date Dose VIS Date Route   Pfizer COVID-19 Vaccine 11/18/2019  9:00 AM 0.3 mL 09/17/2019 Intramuscular   Manufacturer: [place]   Lot: [id number]   [place]: [id number]

## 2019-12-01 DIAGNOSIS — H11153 Pinguecula, bilateral: Secondary | ICD-10-CM | POA: Diagnosis not present

## 2019-12-01 DIAGNOSIS — H02401 Unspecified ptosis of right eyelid: Secondary | ICD-10-CM | POA: Diagnosis not present

## 2019-12-01 DIAGNOSIS — H11823 Conjunctivochalasis, bilateral: Secondary | ICD-10-CM | POA: Diagnosis not present

## 2019-12-01 DIAGNOSIS — H0100A Unspecified blepharitis right eye, upper and lower eyelids: Secondary | ICD-10-CM | POA: Diagnosis not present

## 2019-12-20 DIAGNOSIS — S6000XA Contusion of unspecified finger without damage to nail, initial encounter: Secondary | ICD-10-CM | POA: Diagnosis not present

## 2019-12-20 DIAGNOSIS — S6010XA Contusion of unspecified finger with damage to nail, initial encounter: Secondary | ICD-10-CM | POA: Diagnosis not present

## 2020-01-10 DIAGNOSIS — H2513 Age-related nuclear cataract, bilateral: Secondary | ICD-10-CM | POA: Diagnosis not present

## 2020-01-10 DIAGNOSIS — H2511 Age-related nuclear cataract, right eye: Secondary | ICD-10-CM | POA: Diagnosis not present

## 2020-01-25 DIAGNOSIS — H2511 Age-related nuclear cataract, right eye: Secondary | ICD-10-CM | POA: Diagnosis not present

## 2020-01-25 DIAGNOSIS — H25811 Combined forms of age-related cataract, right eye: Secondary | ICD-10-CM | POA: Diagnosis not present

## 2020-01-31 DIAGNOSIS — H2512 Age-related nuclear cataract, left eye: Secondary | ICD-10-CM | POA: Diagnosis not present

## 2020-02-15 DIAGNOSIS — H25812 Combined forms of age-related cataract, left eye: Secondary | ICD-10-CM | POA: Diagnosis not present

## 2020-02-15 DIAGNOSIS — H2512 Age-related nuclear cataract, left eye: Secondary | ICD-10-CM | POA: Diagnosis not present

## 2020-03-02 DIAGNOSIS — R69 Illness, unspecified: Secondary | ICD-10-CM | POA: Diagnosis not present

## 2020-06-16 DIAGNOSIS — Z823 Family history of stroke: Secondary | ICD-10-CM | POA: Diagnosis not present

## 2020-06-16 DIAGNOSIS — Z85828 Personal history of other malignant neoplasm of skin: Secondary | ICD-10-CM | POA: Diagnosis not present

## 2020-06-16 DIAGNOSIS — Z87891 Personal history of nicotine dependence: Secondary | ICD-10-CM | POA: Diagnosis not present

## 2020-06-18 ENCOUNTER — Other Ambulatory Visit: Payer: Self-pay

## 2020-06-18 ENCOUNTER — Ambulatory Visit: Payer: Medicare HMO | Attending: Internal Medicine

## 2020-06-18 DIAGNOSIS — Z23 Encounter for immunization: Secondary | ICD-10-CM

## 2020-06-18 NOTE — Progress Notes (Signed)
   Covid-19 Vaccination [facility]  Name:  Tracy Mendez    MRN: [id number] DOB: 09-25-1951  06/18/2020  Tracy Mendez was observed post Covid-19 immunization for 15 minutes without incident. She was provided with Vaccine Information Sheet and instruction to access the V-Safe system.   Tracy Mendez was instructed to call 911 with any severe reactions post vaccine: Marland Kitchen Difficulty breathing  . Swelling of face and throat  . A fast heartbeat  . A bad rash all over body  . Dizziness and weakness

## 2020-06-28 DIAGNOSIS — R69 Illness, unspecified: Secondary | ICD-10-CM | POA: Diagnosis not present

## 2020-07-10 DIAGNOSIS — R69 Illness, unspecified: Secondary | ICD-10-CM | POA: Diagnosis not present

## 2020-07-12 DIAGNOSIS — Z1231 Encounter for screening mammogram for malignant neoplasm of breast: Secondary | ICD-10-CM | POA: Diagnosis not present

## 2020-07-30 DIAGNOSIS — R69 Illness, unspecified: Secondary | ICD-10-CM | POA: Diagnosis not present

## 2020-08-09 DIAGNOSIS — D225 Melanocytic nevi of trunk: Secondary | ICD-10-CM | POA: Diagnosis not present

## 2020-08-09 DIAGNOSIS — L570 Actinic keratosis: Secondary | ICD-10-CM | POA: Diagnosis not present

## 2020-08-09 DIAGNOSIS — D224 Melanocytic nevi of scalp and neck: Secondary | ICD-10-CM | POA: Diagnosis not present

## 2020-08-09 DIAGNOSIS — L821 Other seborrheic keratosis: Secondary | ICD-10-CM | POA: Diagnosis not present

## 2020-08-09 DIAGNOSIS — D3617 Benign neoplasm of peripheral nerves and autonomic nervous system of trunk, unspecified: Secondary | ICD-10-CM | POA: Diagnosis not present

## 2020-08-09 DIAGNOSIS — L84 Corns and callosities: Secondary | ICD-10-CM | POA: Diagnosis not present

## 2020-08-09 DIAGNOSIS — Z85828 Personal history of other malignant neoplasm of skin: Secondary | ICD-10-CM | POA: Diagnosis not present

## 2020-08-09 DIAGNOSIS — D485 Neoplasm of uncertain behavior of skin: Secondary | ICD-10-CM | POA: Diagnosis not present

## 2020-08-09 DIAGNOSIS — L578 Other skin changes due to chronic exposure to nonionizing radiation: Secondary | ICD-10-CM | POA: Diagnosis not present

## 2020-08-09 DIAGNOSIS — D2230 Melanocytic nevi of unspecified part of face: Secondary | ICD-10-CM | POA: Diagnosis not present

## 2020-08-09 DIAGNOSIS — L918 Other hypertrophic disorders of the skin: Secondary | ICD-10-CM | POA: Diagnosis not present

## 2020-11-20 DIAGNOSIS — H0100A Unspecified blepharitis right eye, upper and lower eyelids: Secondary | ICD-10-CM | POA: Diagnosis not present

## 2020-11-20 DIAGNOSIS — H11153 Pinguecula, bilateral: Secondary | ICD-10-CM | POA: Diagnosis not present

## 2020-11-20 DIAGNOSIS — H11823 Conjunctivochalasis, bilateral: Secondary | ICD-10-CM | POA: Diagnosis not present

## 2020-11-20 DIAGNOSIS — H02401 Unspecified ptosis of right eyelid: Secondary | ICD-10-CM | POA: Diagnosis not present

## 2020-11-28 DIAGNOSIS — Z1211 Encounter for screening for malignant neoplasm of colon: Secondary | ICD-10-CM | POA: Diagnosis not present

## 2020-11-28 DIAGNOSIS — R202 Paresthesia of skin: Secondary | ICD-10-CM | POA: Diagnosis not present

## 2020-11-28 DIAGNOSIS — H9190 Unspecified hearing loss, unspecified ear: Secondary | ICD-10-CM | POA: Diagnosis not present

## 2020-11-28 DIAGNOSIS — Z13820 Encounter for screening for osteoporosis: Secondary | ICD-10-CM | POA: Diagnosis not present

## 2020-11-28 DIAGNOSIS — Z0000 Encounter for general adult medical examination without abnormal findings: Secondary | ICD-10-CM | POA: Diagnosis not present

## 2020-11-28 DIAGNOSIS — Z1239 Encounter for other screening for malignant neoplasm of breast: Secondary | ICD-10-CM | POA: Diagnosis not present

## 2020-11-28 DIAGNOSIS — E559 Vitamin D deficiency, unspecified: Secondary | ICD-10-CM | POA: Diagnosis not present

## 2021-05-17 DIAGNOSIS — R202 Paresthesia of skin: Secondary | ICD-10-CM | POA: Diagnosis not present

## 2021-05-17 DIAGNOSIS — G43109 Migraine with aura, not intractable, without status migrainosus: Secondary | ICD-10-CM | POA: Diagnosis not present

## 2021-05-17 DIAGNOSIS — Z13820 Encounter for screening for osteoporosis: Secondary | ICD-10-CM | POA: Diagnosis not present

## 2021-05-17 DIAGNOSIS — R7309 Other abnormal glucose: Secondary | ICD-10-CM | POA: Diagnosis not present

## 2021-05-17 DIAGNOSIS — Z1211 Encounter for screening for malignant neoplasm of colon: Secondary | ICD-10-CM | POA: Diagnosis not present

## 2021-05-21 DIAGNOSIS — H26493 Other secondary cataract, bilateral: Secondary | ICD-10-CM | POA: Diagnosis not present

## 2021-05-21 DIAGNOSIS — H43813 Vitreous degeneration, bilateral: Secondary | ICD-10-CM | POA: Diagnosis not present

## 2021-05-21 DIAGNOSIS — H04123 Dry eye syndrome of bilateral lacrimal glands: Secondary | ICD-10-CM | POA: Diagnosis not present

## 2021-05-21 DIAGNOSIS — H40003 Preglaucoma, unspecified, bilateral: Secondary | ICD-10-CM | POA: Diagnosis not present

## 2021-05-28 DIAGNOSIS — Z823 Family history of stroke: Secondary | ICD-10-CM | POA: Diagnosis not present

## 2021-05-28 DIAGNOSIS — K5900 Constipation, unspecified: Secondary | ICD-10-CM | POA: Diagnosis not present

## 2021-05-28 DIAGNOSIS — Z7722 Contact with and (suspected) exposure to environmental tobacco smoke (acute) (chronic): Secondary | ICD-10-CM | POA: Diagnosis not present

## 2021-05-28 DIAGNOSIS — Z791 Long term (current) use of non-steroidal anti-inflammatories (NSAID): Secondary | ICD-10-CM | POA: Diagnosis not present

## 2021-05-28 DIAGNOSIS — Z87891 Personal history of nicotine dependence: Secondary | ICD-10-CM | POA: Diagnosis not present

## 2021-05-28 DIAGNOSIS — G43909 Migraine, unspecified, not intractable, without status migrainosus: Secondary | ICD-10-CM | POA: Diagnosis not present

## 2021-05-28 DIAGNOSIS — G8929 Other chronic pain: Secondary | ICD-10-CM | POA: Diagnosis not present

## 2021-05-28 DIAGNOSIS — M8580 Other specified disorders of bone density and structure, unspecified site: Secondary | ICD-10-CM | POA: Diagnosis not present

## 2021-05-28 DIAGNOSIS — R69 Illness, unspecified: Secondary | ICD-10-CM | POA: Diagnosis not present

## 2021-06-13 DIAGNOSIS — Z780 Asymptomatic menopausal state: Secondary | ICD-10-CM | POA: Diagnosis not present

## 2021-06-13 DIAGNOSIS — M8589 Other specified disorders of bone density and structure, multiple sites: Secondary | ICD-10-CM | POA: Diagnosis not present

## 2021-07-05 DIAGNOSIS — M8580 Other specified disorders of bone density and structure, unspecified site: Secondary | ICD-10-CM | POA: Diagnosis not present

## 2021-07-09 DIAGNOSIS — K648 Other hemorrhoids: Secondary | ICD-10-CM | POA: Diagnosis not present

## 2021-07-09 DIAGNOSIS — Z1211 Encounter for screening for malignant neoplasm of colon: Secondary | ICD-10-CM | POA: Diagnosis not present

## 2021-07-09 DIAGNOSIS — K5900 Constipation, unspecified: Secondary | ICD-10-CM | POA: Diagnosis not present

## 2021-07-23 DIAGNOSIS — Z1231 Encounter for screening mammogram for malignant neoplasm of breast: Secondary | ICD-10-CM | POA: Diagnosis not present

## 2021-08-13 ENCOUNTER — Encounter: Payer: Self-pay | Admitting: Neurology

## 2021-08-13 ENCOUNTER — Ambulatory Visit: Payer: Medicare HMO | Admitting: Neurology

## 2021-08-13 VITALS — BP 125/80 | HR 75 | Ht 68.0 in | Wt 147.0 lb

## 2021-08-13 DIAGNOSIS — R202 Paresthesia of skin: Secondary | ICD-10-CM | POA: Diagnosis not present

## 2021-08-13 DIAGNOSIS — R200 Anesthesia of skin: Secondary | ICD-10-CM | POA: Diagnosis not present

## 2021-08-13 DIAGNOSIS — E538 Deficiency of other specified B group vitamins: Secondary | ICD-10-CM | POA: Diagnosis not present

## 2021-08-13 MED ORDER — CYANOCOBALAMIN 1000 MCG/ML IJ SOLN
1000.0000 ug | Freq: Once | INTRAMUSCULAR | Status: AC
  Administered 2021-08-13: 1000 ug via INTRAMUSCULAR

## 2021-08-13 NOTE — Progress Notes (Signed)
[facility]    Provider:  Dr Jaynee Eagles Requesting Provider: Josetta Huddle, MD Primary Care Provider:  Josetta Huddle, MD  CC:  paresthesias  HPI:  Tracy Mendez is a 70 y.o. female here as requested by Josetta Huddle, MD for peripheral neuropathy, recent B12 deficiency 176(05/17/2021).  She has a past medical history of grief, long-term drug therapy, migraine, insomnia, myalgias, speech-language states delay, situational anxiety, depression, hearing loss, paresthesias, nonrestorative sleep.  I reviewed Dr. Inda Merlin notes, patient recently diagnosed with B12 deficiency 46, she has prediabetes, also having paresthesias in the feet and would like a neurology referral likely idiopathic neuropathy or compressive neuropathy from all the hiking.  Here for evaluation.  She was advised to take B12 over-the-counter.  Her tingling in the feet started in the toes. Slowly progressive and ascending. Not hands. She notices it more/worse at night. Socks make it better. No weakness. No falls, no cramping, slowly progressive. Not painful started about 1.5-2 years ago, no inciting events, she hike a lot and maybe tight boots. No inicigint events, no trauma. No other focal neurologic deficits, associated symptoms, inciting events or modifiable factors.  Reviewed notes, labs and imaging from outside physicians, which showed:  Hemoglobin A1c 5.9 May 17, 2021, BUN 20, creatinine 0.74 BMP May 17, 2021 normal, B12 176 May 17, 2021, TSH same date normal.  CT heda 12/2013:   Ct showed No acute intracranial abnormalities including mass lesion or mass effect, hydrocephalus, extra-axial fluid collection, midline shift, hemorrhage, or acute infarction, large ischemic events (personally reviewed images)    Review of Systems: Patient complains of symptoms per HPI as well as the following symptoms irritability. Pertinent negatives and positives per HPI. All others negative.   Social History    Socioeconomic History   Marital status: Married    Spouse name: Not on file   Number of children: Not on file   Years of education: Not on file   Highest education level: Not on file  Occupational History   Not on file  Tobacco Use   Smoking status: Former    Packs/day: 1.00    Years: 15.00    Pack years: 15.00    Types: Cigarettes   Smokeless tobacco: Never  Vaping Use   Vaping Use: Never used  Substance and Sexual Activity   Alcohol use: Yes    Alcohol/week: 14.0 standard drinks    Types: 14 Glasses of wine per week   Drug use: No   Sexual activity: Never    Birth control/protection: Abstinence  Other Topics Concern   Not on file  Social History Narrative   Not on file   Social Determinants of Health   Financial Resource Strain: Not on file  Food Insecurity: Not on file  Transportation Needs: Not on file  Physical Activity: Not on file  Stress: Not on file  Social Connections: Not on file  Intimate Partner Violence: Not on file    Family History  Problem Relation Age of Onset   Stroke Mother     Past Medical History:  Diagnosis Date   Depression    Migraine     Patient Active Problem List   Diagnosis Date Noted   Plantar fasciitis, left 10/20/2019    Past Surgical History:  Procedure Laterality Date   BREAST SURGERY     augmentation 1983   broken leg     1970 right leg surgery   CARPAL TUNNEL RELEASE     right and left wrist  CHOLECYSTECTOMY     1984   FRACTURE SURGERY     nose job     2012   SHOULDER SURGERY     right 2000    Current Outpatient Medications  Medication Sig Dispense Refill   CALCIUM PO Take by mouth daily.     cholecalciferol (VITAMIN D) 1000 UNITS tablet Take 1,000 Units by mouth daily.     zolmitriptan (ZOMIG) 5 MG tablet Take 5 mg by mouth as needed for migraine.     No current facility-administered medications for this visit.    Allergies as of 08/13/2021 - Review Complete 08/13/2021  Allergen Reaction Noted    Codeine Other (See Comments) 12/08/2013    Vitals: BP 125/80   Pulse 75   Ht 5\' 8"  (1.727 m)   Wt 147 lb (66.7 kg)   BMI 22.35 kg/m  Last Weight:  Wt Readings from Last 1 Encounters:  08/13/21 147 lb (66.7 kg)   Last Height:   Ht Readings from Last 1 Encounters:  08/13/21 5\' 8"  (1.727 m)     Physical exam: Exam: Gen: NAD, conversant, well nourised, well groomed                     CV: RRR, no MRG. No Carotid Bruits. No peripheral edema, warm, nontender Eyes: Conjunctivae clear without exudates or hemorrhage  Neuro: Detailed Neurologic Exam  Speech:    Speech is normal; fluent and spontaneous with normal comprehension.  Cognition:    The patient is oriented to person, place, and time;     recent and remote memory intact;     language fluent;     normal attention, concentration,     fund of knowledge Cranial Nerves:    The pupils are equal, round, and reactive to light. The fundi are flat. Visual fields are full to finger confrontation. Extraocular movements are intact. Trigeminal sensation is intact and the muscles of mastication are normal. The face is symmetric. The palate elevates in the midline. Hearing intact. Voice is normal. Shoulder shrug is normal. The tongue has normal motion without fasciculations.   Coordination:    No dysmetria or ataxia  Gait:    normal.   Motor Observation:    No asymmetry, no atrophy, and no involuntary movements noted. Tone:    Normal muscle tone.    Posture:    Posture is normal. normal erect    Strength:    Strength is V/V in the upper and lower limbs.      Sensation: intact to LT     Reflex Exam:  DTR's:    Deep tendon reflexes in the upper and lower extremities are symmetrical bilaterally.   Toes:    The toes are downgoing bilaterally.   Clonus:    Clonus is absent.    Assessment/Plan:  Lovely patient with parethesias in the toes. B12 176. Hgba1c 5.9. Likely a combination of both. Also drinks 2-2.5 glasses of  wine a night, recommended keeping it 1 glass, alcohol is a direct toxin on the nerves as well.   - test for pernicious anemia  - -May consider daily alpha lipoic acid which is an antioxidant that may reduce free radical oxidative stress associated with diabetic polyneuropathy, existing evidence suggests that alpha lipoic acid significantly reduces stabbing, lancinating and burning pain and diabetic neuropathy with its onset of action as early as 1-2 weeks. 400-600mg  a day.   -B12 injection today. 1070mcg-2000mcg B12 daily po long term. recheck B12 in  12 weeks with pcp (she never got the message from pcp that she was b12 deficient)  -even pre-diabetes can cause numbness and tingling in the feet/nerve damage, address with Dr. Inda Merlin, diet, decrease etoh  - (gave NIH fact sheet)B12 deficiency can cause weakness, fatigue, easy bruising or bleeding,sore tongue, stomach upset, weight loss, and diarrhea or constipation, tingling or numbness to the fingers and toes, difficulty walking, mood changes, depression, memory loss, disorientation and, in severe cases, dementia.   Orders Placed This Encounter  Procedures   Vitamin B1   Intrinsic Factor Antibodies   Anti-parietal antibody   Folate   Meds ordered this encounter  Medications   cyanocobalamin ((VITAMIN B-12)) injection 1,000 mcg    Cc: Josetta Huddle, MD,  Josetta Huddle, MD  Sarina Ill, MD  [facility] [address] [place], [place] [address]  Phone [phone] Fax [phone]

## 2021-08-13 NOTE — Patient Instructions (Addendum)
-May consider daily alpha lipoic acid which is an antioxidant that may reduce free radical oxidative stress associated with diabetic polyneuropathy, existing evidence suggests that alpha lipoic acid significantly reduces stabbing, lancinating and burning pain and diabetic neuropathy with its onset of action as early as 1-2 weeks. 400-600mg  a day.   -1053mcg-2000mcg B12 daily po long term. recheck B12 in 12 weeks  - -even pre-diabetes can cause numbness and tingling in the feet/nerve damage, address with Dr. Inda Merlin, diet   - See infor provided B12 deficiency can cause weakness, fatigue, easy bruising or bleeding,sore tongue, stomach upset, weight loss, and diarrhea or constipation, tingling or numbness to the fingers and toes, difficulty walking, mood changes, depression, memory loss, disorientation and, in severe cases, dementia.  Peripheral Neuropathy Peripheral neuropathy is a type of nerve damage. It affects nerves that carry signals between the spinal cord and the arms, legs, and the rest of the body (peripheral nerves). It does not affect nerves in the spinal cord or brain. In peripheral neuropathy, one nerve or a group of nerves may be damaged. Peripheral neuropathy is a broad category that includes many specific nerve disorders, like diabetic neuropathy, hereditary neuropathy, and carpal tunnel syndrome. What are the causes? This condition may be caused by: Diabetes. This is the most common cause of peripheral neuropathy. Nerve injury. Pressure or stress on a nerve that lasts a long time. Lack (deficiency) of B vitamins. This can result from alcoholism, poor diet, or a restricted diet. Infections. Autoimmune diseases, such as rheumatoid arthritis and systemic lupus erythematosus. Nerve diseases that are passed from parent to child (inherited). Some medicines, such as cancer medicines (chemotherapy). Poisonous (toxic) substances, such as lead and mercury. Too little blood flowing to the  legs. Kidney disease. Thyroid disease. In some cases, the cause of this condition is not known. What are the signs or symptoms? Symptoms of this condition depend on which of your nerves is damaged. Common symptoms include: Loss of feeling (numbness) in the feet, hands, or both. Tingling in the feet, hands, or both. Burning pain. Very sensitive skin. Weakness. Not being able to move a part of the body (paralysis). Muscle twitching. Clumsiness or poor coordination. Loss of balance. Not being able to control your bladder. Feeling dizzy. Sexual problems. How is this diagnosed? Diagnosing and finding the cause of peripheral neuropathy can be difficult. Your health care provider will take your medical history and do a physical exam. A neurological exam will also be done. This involves checking things that are affected by your brain, spinal cord, and nerves (nervous system). For example, your health care provider will check your reflexes, how you move, and what you can feel. You may have other tests, such as: Blood tests. Electromyogram (EMG) and nerve conduction tests. These tests check nerve function and how well the nerves are controlling the muscles. Imaging tests, such as CT scans or MRI to rule out other causes of your symptoms. Removing a small piece of nerve to be examined in a lab (nerve biopsy). Removing and examining a small amount of the fluid that surrounds the brain and spinal cord (lumbar puncture). How is this treated? Treatment for this condition may involve: Treating the underlying cause of the neuropathy, such as diabetes, kidney disease, or vitamin deficiencies. Stopping medicines that can cause neuropathy, such as chemotherapy. Medicine to help relieve pain. Medicines may include: Prescription or over-the-counter pain medicine. Antiseizure medicine. Antidepressants. Pain-relieving patches that are applied to painful areas of skin. Surgery to relieve pressure  on a  nerve or to destroy a nerve that is causing pain. Physical therapy to help improve movement and balance. Devices to help you move around (assistive devices). Follow these instructions at home: Medicines Take over-the-counter and prescription medicines only as told by your health care provider. Do not take any other medicines without first asking your health care provider. Do not drive or use heavy machinery while taking prescription pain medicine. Lifestyle  Do not use any products that contain nicotine or tobacco, such as cigarettes and e-cigarettes. Smoking keeps blood from reaching damaged nerves. If you need help quitting, ask your health care provider. Avoid or limit alcohol. Too much alcohol can cause a vitamin B deficiency, and vitamin B is needed for healthy nerves. Eat a healthy diet. This includes: Eating foods that are high in fiber, such as fresh fruits and vegetables, whole grains, and beans. Limiting foods that are high in fat and processed sugars, such as fried or sweet foods. General instructions  If you have diabetes, work closely with your health care provider to keep your blood sugar under control. If you have numbness in your feet: Check every day for signs of injury or infection. Watch for redness, warmth, and swelling. Wear padded socks and comfortable shoes. These help protect your feet. Develop a good support system. Living with peripheral neuropathy can be stressful. Consider talking with a mental health specialist or joining a support group. Use assistive devices and attend physical therapy as told by your health care provider. This may include using a walker or a cane. Keep all follow-up visits as told by your health care provider. This is important. Contact a health care provider if: You have new signs or symptoms of peripheral neuropathy. You are struggling emotionally from dealing with peripheral neuropathy. Your pain is not well-controlled. Get help right  away if: You have an injury or infection that is not healing normally. You develop new weakness in an arm or leg. You have fallen or do so frequently. Summary Peripheral neuropathy is when the nerves in the arms, or legs are damaged, resulting in numbness, weakness, or pain. There are many causes of peripheral neuropathy, including diabetes, pinched nerves, vitamin deficiencies, autoimmune disease, and hereditary conditions. Diagnosing and finding the cause of peripheral neuropathy can be difficult. Your health care provider will take your medical history, do a physical exam, and do tests, including blood tests and nerve function tests. Treatment involves treating the underlying cause of the neuropathy and taking medicines to help control pain. Physical therapy and assistive devices may also help. This information is not intended to replace advice given to you by your health care provider. Make sure you discuss any questions you have with your health care provider. Document Revised: 07/04/2020 Document Reviewed: 07/04/2020 Elsevier Patient Education  2022 [employer].

## 2021-08-13 NOTE — Progress Notes (Signed)
Gave B12 injection 1035mcg Verbal order given by Dr.Ahern. Gave injection in left deltoid placed bandade on injection site. Walked patient to lab to get labwork.

## 2021-08-15 DIAGNOSIS — L249 Irritant contact dermatitis, unspecified cause: Secondary | ICD-10-CM | POA: Diagnosis not present

## 2021-08-15 DIAGNOSIS — D2230 Melanocytic nevi of unspecified part of face: Secondary | ICD-10-CM | POA: Diagnosis not present

## 2021-08-15 DIAGNOSIS — B353 Tinea pedis: Secondary | ICD-10-CM | POA: Diagnosis not present

## 2021-08-15 DIAGNOSIS — L309 Dermatitis, unspecified: Secondary | ICD-10-CM | POA: Diagnosis not present

## 2021-08-15 DIAGNOSIS — L578 Other skin changes due to chronic exposure to nonionizing radiation: Secondary | ICD-10-CM | POA: Diagnosis not present

## 2021-08-15 DIAGNOSIS — Z85828 Personal history of other malignant neoplasm of skin: Secondary | ICD-10-CM | POA: Diagnosis not present

## 2021-08-15 DIAGNOSIS — S80262A Insect bite (nonvenomous), left knee, initial encounter: Secondary | ICD-10-CM | POA: Diagnosis not present

## 2021-08-15 DIAGNOSIS — L821 Other seborrheic keratosis: Secondary | ICD-10-CM | POA: Diagnosis not present

## 2021-08-15 DIAGNOSIS — D225 Melanocytic nevi of trunk: Secondary | ICD-10-CM | POA: Diagnosis not present

## 2021-08-15 DIAGNOSIS — L570 Actinic keratosis: Secondary | ICD-10-CM | POA: Diagnosis not present

## 2021-08-16 ENCOUNTER — Telehealth: Payer: Self-pay | Admitting: *Deleted

## 2021-08-16 LAB — VITAMIN B1: Thiamine: 101.6 nmol/L (ref 66.5–200.0)

## 2021-08-16 LAB — FOLATE: Folate: 8.7 ng/mL (ref >3.0)

## 2021-08-16 LAB — ANTI-PARIETAL ANTIBODY: Parietal Cell Ab: 1.9 Units (ref 0.0–20.0)

## 2021-08-16 LAB — INTRINSIC FACTOR ANTIBODIES: Intrinsic Factor Abs, Serum: 12.2 AU/mL — ABNORMAL HIGH (ref 0.0–1.1)

## 2021-08-16 NOTE — Telephone Encounter (Signed)
Sent mychart message

## 2021-08-16 NOTE — Telephone Encounter (Signed)
-----   Message from Melvenia Beam, MD sent at 08/14/2021  5:17 PM EST ----- Patient has B12 deficiency recently diagnosed by Dr. 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 That's likely why she has numbness and tingling in her toes (also pre-diabetes). Please forward results to Dr. Inda Merlin and I'll try and contact him and see what he would like to do. thanks

## 2021-08-16 NOTE — Telephone Encounter (Signed)
-----   Message from Melvenia Beam, MD sent at 08/16/2021 11:37 AM EST ----- Regarding: FW: B12 deficiency and intrnsic factor antibodies Let patient know I spoke with Dr. Inda Merlin and he would like her to try oral B12 1000-2000 daily and then recheck B12 in 8-12 weeks. If she is absorbing the B12 then will keep her on oral. If her B12 remains low we will have to transition to monthly injections thanks   ----- Message ----- From: Josetta Huddle, MD Sent: 08/16/2021  11:29 AM EST To: Melvenia Beam, MD Subject: RE: B12 deficiency and intrnsic factor antib#  I prefer oral therapy if they tolerate. Thanks! ----- Message ----- From: Melvenia Beam, MD Sent: 08/14/2021   5:20 PM EST To: Josetta Huddle, MD, Melvenia Beam, MD Subject: B12 deficiency and intrnsic factor antibodies  Dr. Inda Merlin: You recently diagnosed patient with B12 deficiency(176) and looks like she has intrinsic factor antibodies. That's likely why she has numbness and tingling in her toes (also likely due to pre-diabetes). Would you like her to have long-term B12 injections or try oral first and see if she can absorb enough with high oral doses? Thanks, Dr. Jaynee Eagles Vivien Rota)

## 2021-11-01 DIAGNOSIS — R7309 Other abnormal glucose: Secondary | ICD-10-CM | POA: Diagnosis not present

## 2021-11-01 DIAGNOSIS — R7303 Prediabetes: Secondary | ICD-10-CM | POA: Diagnosis not present

## 2021-11-01 DIAGNOSIS — E538 Deficiency of other specified B group vitamins: Secondary | ICD-10-CM | POA: Diagnosis not present

## 2021-11-16 DIAGNOSIS — D124 Benign neoplasm of descending colon: Secondary | ICD-10-CM | POA: Diagnosis not present

## 2021-11-16 DIAGNOSIS — Z1211 Encounter for screening for malignant neoplasm of colon: Secondary | ICD-10-CM | POA: Diagnosis not present

## 2021-11-16 DIAGNOSIS — D123 Benign neoplasm of transverse colon: Secondary | ICD-10-CM | POA: Diagnosis not present

## 2021-11-16 DIAGNOSIS — K648 Other hemorrhoids: Secondary | ICD-10-CM | POA: Diagnosis not present

## 2021-11-19 DIAGNOSIS — M9903 Segmental and somatic dysfunction of lumbar region: Secondary | ICD-10-CM | POA: Diagnosis not present

## 2021-11-19 DIAGNOSIS — M9901 Segmental and somatic dysfunction of cervical region: Secondary | ICD-10-CM | POA: Diagnosis not present

## 2021-11-19 DIAGNOSIS — M9904 Segmental and somatic dysfunction of sacral region: Secondary | ICD-10-CM | POA: Diagnosis not present

## 2021-11-19 DIAGNOSIS — M9902 Segmental and somatic dysfunction of thoracic region: Secondary | ICD-10-CM | POA: Diagnosis not present

## 2021-11-21 DIAGNOSIS — D123 Benign neoplasm of transverse colon: Secondary | ICD-10-CM | POA: Diagnosis not present

## 2021-11-21 DIAGNOSIS — D124 Benign neoplasm of descending colon: Secondary | ICD-10-CM | POA: Diagnosis not present

## 2021-11-26 DIAGNOSIS — M9903 Segmental and somatic dysfunction of lumbar region: Secondary | ICD-10-CM | POA: Diagnosis not present

## 2021-11-26 DIAGNOSIS — M9904 Segmental and somatic dysfunction of sacral region: Secondary | ICD-10-CM | POA: Diagnosis not present

## 2021-11-26 DIAGNOSIS — M9902 Segmental and somatic dysfunction of thoracic region: Secondary | ICD-10-CM | POA: Diagnosis not present

## 2021-11-26 DIAGNOSIS — M9901 Segmental and somatic dysfunction of cervical region: Secondary | ICD-10-CM | POA: Diagnosis not present

## 2021-11-30 DIAGNOSIS — M9901 Segmental and somatic dysfunction of cervical region: Secondary | ICD-10-CM | POA: Diagnosis not present

## 2021-11-30 DIAGNOSIS — M9903 Segmental and somatic dysfunction of lumbar region: Secondary | ICD-10-CM | POA: Diagnosis not present

## 2021-11-30 DIAGNOSIS — M9904 Segmental and somatic dysfunction of sacral region: Secondary | ICD-10-CM | POA: Diagnosis not present

## 2021-11-30 DIAGNOSIS — M9902 Segmental and somatic dysfunction of thoracic region: Secondary | ICD-10-CM | POA: Diagnosis not present

## 2021-12-10 ENCOUNTER — Other Ambulatory Visit: Payer: Self-pay | Admitting: Internal Medicine

## 2021-12-10 ENCOUNTER — Ambulatory Visit
Admission: RE | Admit: 2021-12-10 | Discharge: 2021-12-10 | Disposition: A | Discharge status: Home / Self Care | Payer: Medicare HMO | Source: Ambulatory Visit | Attending: Internal Medicine | Admitting: Internal Medicine

## 2021-12-10 DIAGNOSIS — R61 Generalized hyperhidrosis: Secondary | ICD-10-CM | POA: Diagnosis not present

## 2021-12-10 DIAGNOSIS — R7309 Other abnormal glucose: Secondary | ICD-10-CM | POA: Diagnosis not present

## 2021-12-10 DIAGNOSIS — Z1211 Encounter for screening for malignant neoplasm of colon: Secondary | ICD-10-CM | POA: Diagnosis not present

## 2021-12-10 DIAGNOSIS — E538 Deficiency of other specified B group vitamins: Secondary | ICD-10-CM | POA: Diagnosis not present

## 2021-12-10 DIAGNOSIS — Z13820 Encounter for screening for osteoporosis: Secondary | ICD-10-CM | POA: Diagnosis not present

## 2021-12-10 DIAGNOSIS — H9193 Unspecified hearing loss, bilateral: Secondary | ICD-10-CM | POA: Diagnosis not present

## 2021-12-10 DIAGNOSIS — Z0001 Encounter for general adult medical examination with abnormal findings: Secondary | ICD-10-CM | POA: Diagnosis not present

## 2021-12-10 DIAGNOSIS — E559 Vitamin D deficiency, unspecified: Secondary | ICD-10-CM | POA: Diagnosis not present

## 2021-12-10 DIAGNOSIS — G43109 Migraine with aura, not intractable, without status migrainosus: Secondary | ICD-10-CM | POA: Diagnosis not present

## 2022-03-08 DIAGNOSIS — E538 Deficiency of other specified B group vitamins: Secondary | ICD-10-CM | POA: Diagnosis not present

## 2022-03-08 DIAGNOSIS — E559 Vitamin D deficiency, unspecified: Secondary | ICD-10-CM | POA: Diagnosis not present

## 2022-03-08 DIAGNOSIS — G43109 Migraine with aura, not intractable, without status migrainosus: Secondary | ICD-10-CM | POA: Diagnosis not present

## 2022-03-08 DIAGNOSIS — R7303 Prediabetes: Secondary | ICD-10-CM | POA: Diagnosis not present

## 2022-03-08 DIAGNOSIS — R61 Generalized hyperhidrosis: Secondary | ICD-10-CM | POA: Diagnosis not present

## 2022-04-10 ENCOUNTER — Encounter: Payer: Self-pay | Admitting: Neurology

## 2022-04-10 ENCOUNTER — Ambulatory Visit: Payer: Medicare HMO | Admitting: Neurology

## 2022-04-10 VITALS — BP 108/70 | HR 67 | Ht 68.0 in | Wt 145.4 lb

## 2022-04-10 DIAGNOSIS — R202 Paresthesia of skin: Secondary | ICD-10-CM | POA: Diagnosis not present

## 2022-04-10 DIAGNOSIS — E538 Deficiency of other specified B group vitamins: Secondary | ICD-10-CM

## 2022-04-10 DIAGNOSIS — R200 Anesthesia of skin: Secondary | ICD-10-CM | POA: Diagnosis not present

## 2022-04-10 MED ORDER — GABAPENTIN 300 MG PO CAPS: 300.0000 mg | ORAL_CAPSULE | Freq: Every day | ORAL | 11 refills | Status: AC

## 2022-04-10 NOTE — Patient Instructions (Signed)
Start Gabapentin for foot pain  Continue Alpha-lipoic acid Check B12 Stop alcohol for 3 months or very limited < 1 a day  Gabapentin Capsules or Tablets What is this medication? GABAPENTIN (GA ba pen tin) treats nerve pain. It may also be used to prevent and control seizures in people with epilepsy. It works by calming overactive nerves in your body. This medicine may be used for other purposes; ask your health care provider or pharmacist if you have questions. COMMON BRAND NAME(S): Active-PAC with Gabapentin, Orpha Bur, Gralise, Neurontin What should I tell my care team before I take this medication? They need to know if you have any of these conditions: Alcohol or substance use disorder Kidney disease Lung or breathing disease Suicidal thoughts, plans, or attempt; a previous suicide attempt by you or a family member An unusual or allergic reaction to gabapentin, other medications, foods, dyes, or preservatives Pregnant or trying to get pregnant Breast-feeding How should I use this medication? Take this medication by mouth with a glass of water. Follow the directions on the prescription label. You can take it with or without food. If it upsets your stomach, take it with food. Take your medication at regular intervals. Do not take it more often than directed. Do not stop taking except on your care team's advice. If you are directed to break the 600 or 800 mg tablets in half as part of your dose, the extra half tablet should be used for the next dose. If you have not used the extra half tablet within 28 days, it should be thrown away. A special MedGuide will be given to you by the pharmacist with each prescription and refill. Be sure to read this information carefully each time. Talk to your care team about the use of this medication in children. While this medication may be prescribed for children as young as 3 years for selected conditions, precautions do apply. Overdosage: If you think you  have taken too much of this medicine contact a poison control center or emergency room at once. NOTE: This medicine is only for you. Do not share this medicine with others. What if I miss a dose? If you miss a dose, take it as soon as you can. If it is almost time for your next dose, take only that dose. Do not take double or extra doses. What may interact with this medication? Alcohol Antihistamines for allergy, cough, and cold Certain medications for anxiety or sleep Certain medications for depression like amitriptyline, fluoxetine, sertraline Certain medications for seizures like phenobarbital, primidone Certain medications for stomach problems General anesthetics like halothane, isoflurane, methoxyflurane, propofol Local anesthetics like lidocaine, pramoxine, tetracaine Medications that relax muscles for surgery Opioid medications for pain Phenothiazines like chlorpromazine, mesoridazine, prochlorperazine, thioridazine This list may not describe all possible interactions. Give your health care provider a list of all the medicines, herbs, non-prescription drugs, or dietary supplements you use. Also tell them if you smoke, drink alcohol, or use illegal drugs. Some items may interact with your medicine. What should I watch for while using this medication? Visit your care team for regular checks on your progress. You may want to keep a record at home of how you feel your condition is responding to treatment. You may want to share this information with your care team at each visit. You should contact your care team if your seizures get worse or if you have any new types of seizures. Do not stop taking this medication or any of your seizure  medications unless instructed by your care team. Stopping your medication suddenly can increase your seizures or their severity. This medication may cause serious skin reactions. They can happen weeks to months after starting the medication. Contact your care  team right away if you notice fevers or flu-like symptoms with a rash. The rash may be red or purple and then turn into blisters or peeling of the skin. Or, you might notice a red rash with swelling of the face, lips or lymph nodes in your neck or under your arms. Wear a medical identification bracelet or chain if you are taking this medication for seizures. Carry a card that lists all your medications. This medication may affect your coordination, reaction time, or judgment. Do not drive or operate machinery until you know how this medication affects you. Sit up or stand slowly to reduce the risk of dizzy or fainting spells. Drinking alcohol with this medication can increase the risk of these side effects. Your mouth may get dry. Chewing sugarless gum or sucking hard candy, and drinking plenty of water may help. Watch for new or worsening thoughts of suicide or depression. This includes sudden changes in mood, behaviors, or thoughts. These changes can happen at any time but are more common in the beginning of treatment or after a change in dose. Call your care team right away if you experience these thoughts or worsening depression. If you become pregnant while using this medication, you may enroll in the [place] Pregnancy Registry by calling [id number]. This registry collects information about the safety of antiepileptic medication use during pregnancy. What side effects may I notice from receiving this medication? Side effects that you should report to your care team as soon as possible: Allergic reactions or angioedema--skin rash, itching, hives, swelling of the face, eyes, lips, tongue, arms, or legs, trouble swallowing or breathing Rash, fever, and swollen lymph nodes Thoughts of suicide or self harm, worsening mood, feelings of depression Trouble breathing Unusual changes in mood or behavior in children after use such as difficulty concentrating, hostility, or  restlessness Side effects that usually do not require medical attention (report to your care team if they continue or are bothersome): Dizziness Drowsiness Nausea Swelling of ankles, feet, or hands Vomiting This list may not describe all possible side effects. Call your doctor for medical advice about side effects. You may report side effects to FDA at 1-800-FDA-1088. Where should I keep my medication? Keep out of reach of children and pets. Store at room temperature between 15 and 30 degrees C (59 and 86 degrees F). Get rid of any unused medication after the expiration date. This medication may cause accidental overdose and death if taken by other adults, children, or pets. To get rid of medications that are no longer needed or have expired: Take the medication to a medication take-back program. Check with your pharmacy or law enforcement to find a location. If you cannot return the medication, check the label or package insert to see if the medication should be thrown out in the garbage or flushed down the toilet. If you are not sure, ask your care team. If it is safe to put it in the trash, empty the medication out of the container. Mix the medication with cat litter, dirt, coffee grounds, or other unwanted substance. Seal the mixture in a bag or container. Put it in the trash. NOTE: This sheet is a summary. It may not cover all possible information. If you have questions about  this medicine, talk to your doctor, pharmacist, or health care provider.  2023 Elsevier/Gold Standard (2021-03-26 00:00:00)

## 2022-04-10 NOTE — Progress Notes (Signed)
[facility]    Provider:  Dr Jaynee Eagles Requesting Provider: Josetta Huddle, MD Primary Care Provider:  Josetta Huddle, MD  CC:  paresthesias  04/10/2022: Here for follow-up of paresthesias in the setting of very low B12, daily alcohol use, prediabetes.  The numbness and tingling is uncomfortable at night. In February her repeat b12 was fine she was taking sublingual supplementation, sublingual seems to be working but she has not been taking it daily as of recently. She still drinks alcohol nightly which can be a cause of neuropathy it as well as pre-diabetes, we discussed this.  She takes alpha-lipoic acid every day.  Her feet are uncomfortable. She feels slightly progressed but continues to drink alcohol, advices to stop for 3 months, she also had a cancer screening per patient with Dr. Inda Merlin due ot hot flashes (paraneoplastic antibodies can cause neuropathy) was reportedly negative. She says HgbA1c was normal in march/April  Patient complains of symptoms per HPI as well as the following symptoms: uncomfortable pain in feet . Pertinent negatives and positives per HPI. All others negative   HPI:  Tracy Mendez is a 71 y.o. female here as requested by Josetta Huddle, MD for peripheral neuropathy, recent B12 deficiency 176(05/17/2021).  She has a past medical history of grief, long-term drug therapy, migraine, insomnia, myalgias, speech-language states delay, situational anxiety, depression, hearing loss, paresthesias, nonrestorative sleep.  I reviewed Dr. Inda Merlin notes, patient recently diagnosed with B12 deficiency 76, she has prediabetes, also having paresthesias in the feet and would like a neurology referral likely idiopathic neuropathy or compressive neuropathy from all the hiking.  Here for evaluation.  She was advised to take B12 over-the-counter.  Her tingling in the feet started in the toes. Slowly progressive and ascending. Not hands. She notices it more/worse at night. Socks  make it better. No weakness. No falls, no cramping, slowly progressive. Not painful started about 1.5-2 years ago, no inciting events, she hike a lot and maybe tight boots. No inicigint events, no trauma. No other focal neurologic deficits, associated symptoms, inciting events or modifiable factors.  Reviewed notes, labs and imaging from outside physicians, which showed:  Hemoglobin A1c 5.9 May 17, 2021, BUN 20, creatinine 0.74 BMP May 17, 2021 normal, B12 176 May 17, 2021, TSH same date normal.  CT heda 12/2013:   Ct showed No acute intracranial abnormalities including mass lesion or mass effect, hydrocephalus, extra-axial fluid collection, midline shift, hemorrhage, or acute infarction, large ischemic events (personally reviewed images)    Review of Systems: Patient complains of symptoms per HPI as well as the following symptoms irritability. Pertinent negatives and positives per HPI. All others negative.   Social History   Socioeconomic History   Marital status: Married    Spouse name: Not on file   Number of children: Not on file   Years of education: Not on file   Highest education level: Not on file  Occupational History   Not on file  Tobacco Use   Smoking status: Former    Packs/day: 1.00    Years: 15.00    Total pack years: 15.00    Types: Cigarettes   Smokeless tobacco: Never  Vaping Use   Vaping Use: Never used  Substance and Sexual Activity   Alcohol use: Yes    Alcohol/week: 10.0 standard drinks of alcohol    Types: 10 Glasses of wine per week   Drug use: No   Sexual activity: Never    Birth control/protection: Abstinence  Other Topics  Concern   Not on file  Social History Narrative   Not on file   Social Determinants of Health   Financial Resource Strain: Not on file  Food Insecurity: Not on file  Transportation Needs: Not on file  Physical Activity: Not on file  Stress: Not on file  Social Connections: Not on file  Intimate Partner  Violence: Not on file    Family History  Problem Relation Age of Onset   Stroke Mother    Neuropathy Neg Hx     Past Medical History:  Diagnosis Date   Depression    Migraine     Patient Active Problem List   Diagnosis Date Noted   Plantar fasciitis, left 10/20/2019    Past Surgical History:  Procedure Laterality Date   BREAST SURGERY     augmentation 1983   broken leg     1970 right leg surgery   CARPAL TUNNEL RELEASE     right and left wrist   CHOLECYSTECTOMY     1984   FRACTURE SURGERY     nose job     2012   SHOULDER SURGERY     right 2000    Current Outpatient Medications  Medication Sig Dispense Refill   Alpha Lipoic Acid 200 MG CAPS 1 capsule     CALCIUM PO Take by mouth daily.     cholecalciferol (VITAMIN D) 1000 UNITS tablet Take 1,000 Units by mouth daily.     gabapentin (NEURONTIN) 300 MG capsule Take 1-2 capsules (300-600 mg total) by mouth at bedtime. 60 capsule 11   UNABLE TO FIND Med Name:B12 sublingual     zolmitriptan (ZOMIG) 5 MG tablet Take 5 mg by mouth as needed for migraine.     No current facility-administered medications for this visit.    Allergies as of 04/10/2022 - Review Complete 04/10/2022  Allergen Reaction Noted   Codeine Other (See Comments) 12/08/2013    Vitals: BP 108/70   Pulse 67   Ht 5' 8"  (1.727 m)   Wt 145 lb 6.4 oz (66 kg)   BMI 22.11 kg/m  Last Weight:  Wt Readings from Last 1 Encounters:  04/10/22 145 lb 6.4 oz (66 kg)   Last Height:   Ht Readings from Last 1 Encounters:  04/10/22 5' 8"  (1.727 m)    Exam: NAD, pleasant                  Speech:    Speech is normal; fluent and spontaneous with normal comprehension.  Cognition:    The patient is oriented to person, place, and time;     recent and remote memory intact;     language fluent;    Cranial Nerves:    The pupils are equal, round, and reactive to light.Trigeminal sensation is intact and the muscles of mastication are normal. The face is  symmetric. The palate elevates in the midline. Hearing intact. Voice is normal. Shoulder shrug is normal. The tongue has normal motion without fasciculations.   Coordination:  No dysmetria  Motor Observation:    No asymmetry, no atrophy, and no involuntary movements noted. Tone:    Normal muscle tone.     Strength:    Strength is V/V in the upper and lower limbs.      Sensation: intact to LT, pin prick distally     Assessment/Plan:  Lovely patient with parethesias in the toes. B12 176. Hgba1c 5.9. Also drinks 2-2.5 glasses of wine a night, recommended keeping it  1 glass, alcohol is a direct toxin on the nerves as well.+intrinsic factor antibodies.   -Paresthesias are likely a combination of B12 deficiency, prediabetes, and possibly due to her alcohol use. -She has been taking sublingual B12, since she has positive intrinsic factor antibodies will check a level today -Advised her to stop drinking alcohol or at least drink less than 1 alcoholic beverage and evening --even pre-diabetes can cause numbness and tingling in the feet/nerve damage, address with Dr. Inda Merlin, diet, decrease etoh, will order a few more tests today - test for pernicious anemia: +intrinsic factor antibodies  - Continus daily alpha lipoic acid which is an antioxidant that may reduce free radical oxidative stress associated with diabetic polyneuropathy, existing evidence suggests that alpha lipoic acid significantly reduces stabbing, lancinating and burning pain and diabetic neuropathy with its onset of action as early as 1-2 weeks. 400-631m a day.  - (gave NIH fact sheet)B12 deficiency can cause weakness, fatigue, easy bruising or bleeding,sore tongue, stomach upset, weight loss, and diarrhea or constipation, tingling or numbness to the fingers and toes, difficulty walking, mood changes, depression, memory loss, disorientation and, in severe cases, dementia.  - start Gabapentin at bedtime  Orders Placed This Encounter   Procedures   Vitamin B12   Methylmalonic acid, serum   ANA Comprehensive Panel   Multiple Myeloma Panel (SPEP&IFE w/QIG)   Sjogren's syndrome antibods(ssa + ssb)   Rheumatoid factor   Vitamin B6   Heavy metals, blood   B12 and Folate Panel   Methylmalonic acid, serum   Meds ordered this encounter  Medications   gabapentin (NEURONTIN) 300 MG capsule    Sig: Take 1-2 capsules (300-600 mg total) by mouth at bedtime.    Dispense:  60 capsule    Refill:  11    Cc: GJosetta Huddle MD,  GJosetta Huddle MD  ASarina Ill MD  [facility] [address] [address] Phone [phone] [phone] I spent over 30  minutes of face-to-face and non-face-to-face time with patient on the  1. Numbness and tingling of both feet   2. B12 deficiency    diagnosis.  This included previsit chart review, lab review, study review, order entry, electronic health record documentation, patient education on the different diagnostic and therapeutic options, counseling and coordination of care, risks and benefits of management, compliance, or risk factor reduction

## 2022-04-12 LAB — ANA COMPREHENSIVE PANEL
Anti JO-1: <0.2 AI (ref 0.0–0.9)
Centromere Ab Screen: <0.2 AI (ref 0.0–0.9)
Chromatin Ab SerPl-aCnc: <0.2 AI (ref 0.0–0.9)
ENA RNP Ab: 0.4 AI (ref 0.0–0.9)
ENA SM Ab Ser-aCnc: <0.2 AI (ref 0.0–0.9)
ENA SSA (RO) Ab: <0.2 AI (ref 0.0–0.9)
dsDNA Ab: <1 IU/mL (ref 0–9)

## 2022-04-12 LAB — MULTIPLE MYELOMA PANEL, SERUM

## 2022-04-12 LAB — HEAVY METALS, BLOOD
Arsenic: 4 ug/L (ref 0–9)
Lead, Blood: 1.7 ug/dL (ref 0.0–3.4)

## 2022-04-12 LAB — METHYLMALONIC ACID, SERUM: Methylmalonic Acid: 182 nmol/L (ref 0–378)

## 2022-04-12 LAB — VITAMIN B6: Vitamin B6: 15.4 ug/L (ref 3.4–65.2)

## 2022-04-17 LAB — MULTIPLE MYELOMA PANEL, SERUM
Alpha 1: 0.2 g/dL (ref 0.0–0.4)
Alpha2 Glob SerPl Elph-Mcnc: 0.5 g/dL (ref 0.4–1.0)
Globulin, Total: 2.6 g/dL (ref 2.2–3.9)
IgA/Immunoglobulin A, Serum: 345 mg/dL (ref 64–422)
IgG (Immunoglobin G), Serum: 1033 mg/dL (ref 586–1602)
Total Protein: 6.4 g/dL (ref 6.0–8.5)

## 2022-04-17 LAB — ANA COMPREHENSIVE PANEL
ENA SSB (LA) Ab: <0.2 AI (ref 0.0–0.9)
Scleroderma (Scl-70) (ENA) Antibody, IgG: <0.2 AI (ref 0.0–0.9)

## 2022-04-17 LAB — HEAVY METALS, BLOOD: Mercury: 3.4 ug/L (ref 0.0–14.9)

## 2022-04-17 LAB — B12 AND FOLATE PANEL
Folate: 12 ng/mL (ref >3.0)
Vitamin B-12: 950 pg/mL (ref 232–1245)

## 2022-04-17 LAB — RHEUMATOID FACTOR: Rhuematoid fact SerPl-aCnc: 10.6 IU/mL (ref <14.0)

## 2022-04-18 DIAGNOSIS — H26493 Other secondary cataract, bilateral: Secondary | ICD-10-CM | POA: Diagnosis not present

## 2022-04-25 DIAGNOSIS — Z86010 Personal history of colonic polyps: Secondary | ICD-10-CM | POA: Diagnosis not present

## 2022-04-25 DIAGNOSIS — K5900 Constipation, unspecified: Secondary | ICD-10-CM | POA: Diagnosis not present

## 2022-04-25 DIAGNOSIS — K648 Other hemorrhoids: Secondary | ICD-10-CM | POA: Diagnosis not present

## 2022-05-29 DIAGNOSIS — H26493 Other secondary cataract, bilateral: Secondary | ICD-10-CM | POA: Diagnosis not present

## 2022-07-10 ENCOUNTER — Ambulatory Visit (HOSPITAL_BASED_OUTPATIENT_CLINIC_OR_DEPARTMENT_OTHER): Payer: Medicare HMO | Attending: Internal Medicine | Admitting: Physical Therapy

## 2022-07-10 ENCOUNTER — Encounter (HOSPITAL_BASED_OUTPATIENT_CLINIC_OR_DEPARTMENT_OTHER): Payer: Self-pay | Admitting: Physical Therapy

## 2022-07-10 ENCOUNTER — Other Ambulatory Visit: Payer: Self-pay

## 2022-07-10 DIAGNOSIS — R2689 Other abnormalities of gait and mobility: Secondary | ICD-10-CM | POA: Insufficient documentation

## 2022-07-10 DIAGNOSIS — M6281 Muscle weakness (generalized): Secondary | ICD-10-CM | POA: Diagnosis not present

## 2022-07-10 NOTE — Therapy (Signed)
OUTPATIENT PHYSICAL THERAPY EVALUATION   Patient Name: Tracy Mendez MRN: [id number] DOB:02/06/1951, 71 y.o., female Today's Date: 07/10/2022   PT End of Session - 07/10/22 1122     Visit Number 1    Number of Visits 8    Date for PT Re-Evaluation 10/08/22    Authorization Type [employer] MCR    PT Start Time 0805    PT Stop Time 0845    PT Time Calculation (min) 40 min    Activity Tolerance Patient tolerated treatment well    Behavior During Therapy [facility] for tasks assessed/performed             Past Medical History:  Diagnosis Date   Depression    Migraine    Past Surgical History:  Procedure Laterality Date   BREAST SURGERY     augmentation 1983   broken leg     1970 right leg surgery   CARPAL TUNNEL RELEASE     right and left wrist   CHOLECYSTECTOMY     1984   FRACTURE SURGERY     nose job     2012   SHOULDER SURGERY     right 2000   Patient Active Problem List   Diagnosis Date Noted   Plantar fasciitis, left 10/20/2019    PCP: Josetta Huddle, MD  REFERRING PROVIDER: Josetta Huddle, MD  REFERRING DIAG: R26.89 (ICD-10-CM) - Other abnormalities of gait and mobility  Rationale for Evaluation and Treatment Rehabilitation  THERAPY DIAG:  Other abnormalities of gait and mobility - Plan: PT plan of care cert/re-cert  Muscle weakness (generalized) - Plan: PT plan of care cert/re-cert  ONSET DATE: 8127  SUBJECTIVE:                                                                                                                                                                                           SUBJECTIVE STATEMENT: Pt states she is here for posture and balance. She hikes daily/near daily and feels her balance is more difficult on a narrow walkway. Aware that balance is declining over the years. Pt does have history of neuropathy. Pt has neuropathy from the toes to the ball of her feet on the bottoms. Pt has less balance when eyes closed. Unable to get on  ladder with any comfort or feeling of security. Denies history of previous balance issues or neurological deficits.  Pt denies vertigo symptoms. Hiking is main form of exercise.   PERTINENT HISTORY:   History of vertigo 25 years ago; LLD with R LE shorter after fracture- 0.5" deficit; plantar neuropathy  PAIN:  Are you having pain? No  PRECAUTIONS: None  WEIGHT BEARING RESTRICTIONS No  FALLS:  Has patient fallen in last 6 months? Yes, 1x on hiking trail when tripping over a root  LIVING ENVIRONMENT: Lives with: lives with their family and lives with their spouse Lives in: House/apartment Stairs: Yes Has following equipment at home: None  OCCUPATION: retired  PLOF: Independent  PATIENT GOALS : improve posture and improve balance and feelings of LOB during hiking   OBJECTIVE:   DIAGNOSTIC FINDINGS:  N/A  PATIENT SURVEYS:  Total ABC score: 1230 / 1600 = 76.9 %  COGNITION:  Overall cognitive status: Within functional limits for tasks assessed     SENSATION: Light touch: Impaired    POSTURE: rounded shoulders, forward head, and increased thoracic kyphosis; genu valgus in standing   LOWER EXTREMITY ROM:   WFL throughout bilat LE  LOWER EXTREMITY MMT:    MMT Right eval Left eval  Hip flexion 4/5 4/5  Hip extension 4/5 4/5  Hip abduction 4/5 4/5  Hip adduction 4+/5 4+/5  Hip internal rotation    Hip external rotation    Knee flexion 4/5 4/5  Knee extension 4+/5 4+/5   (Blank rows = not tested)   FUNCTIONAL TESTS:  5 times sit to stand: 15s Tandem walking: unable to perform without LOB SLS: <10s on each limb  Squat: increased knee wobble, decreased depth, quad dominant, apprehension with descent  GAIT: Distance walked: 10f Assistive device utilized: None Level of assistance: Complete Independence Comments: Bilat Trendelenburg    TODAY'S TREATMENT  Heel lift provided for R shoe, no significant change in balance, advised to remove if pain  occurs  Exercises - Standing Shoulder External Rotation with Resistance  - 1-2 x daily - 7 x weekly - 2 sets - 10 reps - Seated Thoracic Lumbar Extension  - 1-2 x daily - 7 x weekly - 1 sets - 10 reps - 3 hold - Wall Angels  - 1-2 x daily - 7 x weekly - 1 sets - 15 reps - 2 hold - Sit to Stand with Resistance Around Legs  - 1-2 x daily - 7 x weekly - 2 sets - 10 reps - Tandem Walking with Counter Support  - 1-2 x daily - 7 x weekly - 1 sets - 3 reps - 239fhold - Side Stepping with Resistance at Thighs  - 1-2 x daily - 7 x weekly - 1 sets - 3 reps - 2547fold   PATIENT EDUCATION:  Education details: diagnosis, prognosis, anatomy, exercise progression, DOMS expectations, muscle firing,  envelope of function, HEP, POC  Person educated: Patient Education method: Explanation, Demonstration, Tactile cues, Verbal cues, and Handouts Education comprehension: verbalized understanding, returned demonstration, verbal cues required, and tactile cues required   HOME EXERCISE PROGRAM: Access Code: [email or web address]: [email or web address] Date: 07/10/2022 Prepared by: AlaDaleen BoSSESSMENT:  CLINICAL IMPRESSION: Patient is a 71 25o. female who was seen today for physical therapy evaluation and treatment for c/c of balance deficits and postural deficits. Pt's s/s appear consistent with generalized balance deficits with likely contributing factors from LE strength deficits and proprioceptive deficits. Pt is without pain at this time. However, pt with significant T/S stiffness affecting pt's posture. Although there is no significant pain, pt would like to prevent further decline of posture. Pt is largely strength limited at this time in LE. Plan to progress HEP for posture and balance as tolerated. Pt would benefit from continued skilled therapy in order to reach goals and maximize functional LE strength  and ROM for full return to PLOF.    OBJECTIVE IMPAIRMENTS Abnormal gait, decreased activity  tolerance, decreased balance, decreased mobility, difficulty walking, decreased ROM, decreased strength, hypomobility, impaired sensation, improper body mechanics, and postural dysfunction.   ACTIVITY LIMITATIONS standing, squatting, stairs, transfers, reach over head, and locomotion level  PARTICIPATION LIMITATIONS: community activity, yard work, and exercise  PERSONAL FACTORS Age, Fitness, Time since onset of injury/illness/exacerbation, and 1 comorbidity:    are also affecting patient's functional outcome.   REHAB POTENTIAL: Good  CLINICAL DECISION MAKING: Stable/uncomplicated  EVALUATION COMPLEXITY: Low   GOALS:   SHORT TERM GOALS: Target date: 08/21/2022  Pt will become independent with HEP in order to demonstrate synthesis of PT education.  Goal status: INITIAL  2.  Pt will be able to demonstrate ability to perform T/S ext into chair without pain in order to demonstrate functional improvement in postural tolerance and ROM.  Goal status: INITIAL  LONG TERM GOALS: Target date: 10/02/2022  Pt  will become independent with final HEP in order to demonstrate synthesis of PT education.   Goal status: INITIAL  2.  Pt will be able to demonstrate/report ability to walk/hike >1 mile without report of LOB in order to demonstrate functional improvement and tolerance to exercise and dynamic tasks.  Goal status: INITIAL  3.  Pt will demonstrate at least a improvement to at least 80% on ABC scale in order to demonstrate a clinically significant change to cut off score for balance in older adults.   Goal status: INITIAL  4. Pt will be able to perform 5XSTS in under 12s  in order to demonstrate functional improvement above the cut off score for adults.  Goal status: INITIAL  PLAN: PT FREQUENCY: every other week  PT DURATION: 12 weeks (likely D/C in 6 wks)  PLANNED INTERVENTIONS: Therapeutic exercises, Therapeutic activity, Neuromuscular re-education, Balance training, Gait  training, Patient/Family education, Self Care, Joint mobilization, Joint manipulation, Stair training, Vestibular training, DME instructions, Aquatic Therapy, Dry Needling, Electrical stimulation, Spinal manipulation, Spinal mobilization, Cryotherapy, Moist heat, Taping, Vasopneumatic device, Biofeedback, Manual therapy, and Re-evaluation.  PLAN FOR NEXT SESSION: progress LE strength and dynamic balance   Daleen Bo, PT 07/10/2022, 1:31 PM

## 2022-08-12 DIAGNOSIS — Z1231 Encounter for screening mammogram for malignant neoplasm of breast: Secondary | ICD-10-CM | POA: Diagnosis not present

## 2022-08-13 ENCOUNTER — Encounter (HOSPITAL_BASED_OUTPATIENT_CLINIC_OR_DEPARTMENT_OTHER): Payer: Self-pay | Admitting: Physical Therapy

## 2022-08-13 ENCOUNTER — Ambulatory Visit (HOSPITAL_BASED_OUTPATIENT_CLINIC_OR_DEPARTMENT_OTHER): Payer: Medicare HMO | Attending: Internal Medicine | Admitting: Physical Therapy

## 2022-08-13 DIAGNOSIS — R2689 Other abnormalities of gait and mobility: Secondary | ICD-10-CM | POA: Insufficient documentation

## 2022-08-13 DIAGNOSIS — M6281 Muscle weakness (generalized): Secondary | ICD-10-CM | POA: Insufficient documentation

## 2022-08-13 NOTE — Therapy (Signed)
OUTPATIENT PHYSICAL THERAPY TREATMENT   Patient Name: Tracy Mendez MRN: [id number] DOB:[birth year], 70 y.o., female Today's Date: 08/13/2022   PT End of Session - 08/13/22 1547     Visit Number 2    Number of Visits 8    Date for PT Re-Evaluation 10/08/22    Authorization Type [employer] MCR    PT Start Time 1556    PT Stop Time 0737    PT Time Calculation (min) 39 min    Activity Tolerance Patient tolerated treatment well    Behavior During Therapy WFL for tasks assessed/performed              Past Medical History:  Diagnosis Date   Depression    Migraine    Past Surgical History:  Procedure Laterality Date   BREAST SURGERY     augmentation 1983   broken leg     1970 right leg surgery   CARPAL TUNNEL RELEASE     right and left wrist   CHOLECYSTECTOMY     1984   FRACTURE SURGERY     nose job     2012   SHOULDER SURGERY     right 2000   Patient Active Problem List   Diagnosis Date Noted   Plantar fasciitis, left 10/20/2019    PCP: Josetta Huddle, MD  REFERRING PROVIDER: Josetta Huddle, MD  REFERRING DIAG: R26.89 (ICD-10-CM) - Other abnormalities of gait and mobility  Rationale for Evaluation and Treatment Rehabilitation  THERAPY DIAG:  Other abnormalities of gait and mobility  Muscle weakness (generalized)  ONSET DATE: 2023  SUBJECTIVE:                                                                                                                                                                                           SUBJECTIVE STATEMENT:  Pt states the balance is better and improving consistently. She has improved with the tandem walking. She notices she is better with SLS and hiked today without issues. No falls noted since last session.    Eval:  Pt states she is here for posture and balance. She hikes daily/near daily and feels her balance is more difficult on a narrow walkway. Aware that balance is declining over the years. Pt does have  history of neuropathy. Pt has neuropathy from the toes to the ball of her feet on the bottoms. Pt has less balance when eyes closed. Unable to get on ladder with any comfort or feeling of security. Denies history of previous balance issues or neurological deficits.  Pt denies vertigo symptoms. Hiking is main form of exercise.   PERTINENT HISTORY:  History of vertigo 25 years ago; LLD with R LE shorter after fracture- 0.5" deficit; plantar neuropathy  PAIN:  Are you having pain? No   PRECAUTIONS: None  WEIGHT BEARING RESTRICTIONS No  FALLS:  Has patient fallen in last 6 months? Yes, 1x on hiking trail when tripping over a root  LIVING ENVIRONMENT: Lives with: lives with their family and lives with their spouse Lives in: House/apartment Stairs: Yes Has following equipment at home: None  OCCUPATION: retired  PLOF: Independent  PATIENT GOALS : improve posture and improve balance and feelings of LOB during hiking   OBJECTIVE:    PATIENT SURVEYS:  Total ABC score: 1230 / 1600 = 76.9 %   Eval FUNCTIONAL TESTS:  5 times sit to stand: 15s Tandem walking: unable to perform without LOB SLS: <10s on each limb  Squat: increased knee wobble, decreased depth, quad dominant, apprehension with descent   TODAY'S TREATMENT   Tandem walking 47f x2 Toe walking and heel walking 35 ft x2 Step taps 20x Lateral step up 8" box 2x10 Runner's step up 2x10 Heel tap 8x each leg Star tap/reach 3x6 Seated T/S ext 5s 10x  Standing row green TB 2x10   PATIENT EDUCATION:  Education details: exercise progression, HEP, POC  Person educated: Patient Education method: Explanation, Demonstration, Tactile cues, Verbal cues, and Handouts Education comprehension: verbalized understanding, returned demonstration, verbal cues required, and tactile cues required   HOME EXERCISE PROGRAM: Access Code: [email or web address]: [email or web address] Date: 07/10/2022 Prepared by: ADaleen Bo ASSESSMENT:  CLINICAL IMPRESSION: Pt with significant improvement in tandem position exercise at today's session. Pt able to perform all gait tasks with decreased BOS without significant LOB compared to eval. Pt able to progress to SL focus dynamic activity today. Pt still with single LE hip and ankle stability deficits but able perform all exercise safely and demo's ability to safely perform at home. Pt HEP updated at this time. Plan to continue with SL stability and strength as tolerated at future sessions. Pt has made great progress with independent exercise. Pt would benefit from continued skilled therapy in order to reach goals and maximize functional LE strength and ROM for full return to PLOF.    OBJECTIVE IMPAIRMENTS Abnormal gait, decreased activity tolerance, decreased balance, decreased mobility, difficulty walking, decreased ROM, decreased strength, hypomobility, impaired sensation, improper body mechanics, and postural dysfunction.   ACTIVITY LIMITATIONS standing, squatting, stairs, transfers, reach over head, and locomotion level  PARTICIPATION LIMITATIONS: community activity, yard work, and exercise  PERSONAL FACTORS Age, Fitness, Time since onset of injury/illness/exacerbation, and 1 comorbidity:    are also affecting patient's functional outcome.   REHAB POTENTIAL: Good  CLINICAL DECISION MAKING: Stable/uncomplicated  EVALUATION COMPLEXITY: Low   GOALS:   SHORT TERM GOALS: Target date: 09/24/2022  Pt will become independent with HEP in order to demonstrate synthesis of PT education.  Goal status: met  2.  Pt will be able to demonstrate ability to perform T/S ext into chair without pain in order to demonstrate functional improvement in postural tolerance and ROM.  Goal status: met  LONG TERM GOALS: Target date: 11/05/2022  Pt  will become independent with final HEP in order to demonstrate synthesis of PT education.   Goal status: ongoing  2.  Pt will be  able to demonstrate/report ability to walk/hike >1 mile without report of LOB in order to demonstrate functional improvement and tolerance to exercise and dynamic tasks.  Goal status: met  3.  Pt will demonstrate at least  a improvement to at least 80% on ABC scale in order to demonstrate a clinically significant change to cut off score for balance in older adults.   Goal status: ongoing  4. Pt will be able to perform 5XSTS in under 12s  in order to demonstrate functional improvement above the cut off score for adults.  Goal status: ongoing  PLAN: PT FREQUENCY: every other week  PT DURATION: 12 weeks (likely D/C in 6 wks)  PLANNED INTERVENTIONS: Therapeutic exercises, Therapeutic activity, Neuromuscular re-education, Balance training, Gait training, Patient/Family education, Self Care, Joint mobilization, Joint manipulation, Stair training, Vestibular training, DME instructions, Aquatic Therapy, Dry Needling, Electrical stimulation, Spinal manipulation, Spinal mobilization, Cryotherapy, Moist heat, Taping, Vasopneumatic device, Biofeedback, Manual therapy, and Re-evaluation.  PLAN FOR NEXT SESSION: progress LE strength and dynamic balance   Daleen Bo, PT 08/13/2022, 4:39 PM

## 2022-08-15 DIAGNOSIS — D2230 Melanocytic nevi of unspecified part of face: Secondary | ICD-10-CM | POA: Diagnosis not present

## 2022-08-15 DIAGNOSIS — L239 Allergic contact dermatitis, unspecified cause: Secondary | ICD-10-CM | POA: Diagnosis not present

## 2022-08-15 DIAGNOSIS — B351 Tinea unguium: Secondary | ICD-10-CM | POA: Diagnosis not present

## 2022-08-15 DIAGNOSIS — L821 Other seborrheic keratosis: Secondary | ICD-10-CM | POA: Diagnosis not present

## 2022-08-15 DIAGNOSIS — Z85828 Personal history of other malignant neoplasm of skin: Secondary | ICD-10-CM | POA: Diagnosis not present

## 2022-08-15 DIAGNOSIS — L570 Actinic keratosis: Secondary | ICD-10-CM | POA: Diagnosis not present

## 2022-08-15 DIAGNOSIS — L578 Other skin changes due to chronic exposure to nonionizing radiation: Secondary | ICD-10-CM | POA: Diagnosis not present

## 2022-08-15 DIAGNOSIS — D225 Melanocytic nevi of trunk: Secondary | ICD-10-CM | POA: Diagnosis not present

## 2022-09-03 ENCOUNTER — Encounter (HOSPITAL_BASED_OUTPATIENT_CLINIC_OR_DEPARTMENT_OTHER): Payer: Self-pay

## 2022-09-03 ENCOUNTER — Encounter (HOSPITAL_BASED_OUTPATIENT_CLINIC_OR_DEPARTMENT_OTHER): Payer: Medicare HMO | Admitting: Physical Therapy

## 2022-09-06 DIAGNOSIS — R300 Dysuria: Secondary | ICD-10-CM | POA: Diagnosis not present

## 2022-09-29 NOTE — Therapy (Incomplete)
OUTPATIENT PHYSICAL THERAPY TREATMENT   Patient Name: Tracy Mendez MRN: [id number] DOB:[birth year], 71 y.o., female Today's Date: 10/01/2022   PT End of Session - 10/01/22 1438     Visit Number 3    Number of Visits 4    Date for PT Re-Evaluation 10/08/22    Authorization Type [employer]et[employer] MCR    PT Start Time 1435    PT Stop Time 1515    PT Time Calculation (min) 40 min    Activity Tolerance Patient tolerated treatment well    Behavior During Therapy WFL for tasks assessed/performed               Past Medical History:  Diagnosis Date   Depression    Migraine    Past Surgical History:  Procedure Laterality Date   BREAST SURGERY     augmentation 1983   broken leg     1970 right leg surgery   CARPAL TUNNEL RELEASE     right and left wrist   CHOLECYSTECTOMY     1984   FRACTURE SURGERY     nose job     2012   SHOULDER SURGERY     right 2000   Patient Active Problem List   Diagnosis Date Noted   Plantar fasciitis, left 10/20/2019    PCP: Josetta Huddle, MD  REFERRING PROVIDER: Josetta Huddle, MD  REFERRING DIAG: R26.89 (ICD-10-CM) - Other abnormalities of gait and mobility  Rationale for Evaluation and Treatment Rehabilitation  THERAPY DIAG:  Other abnormalities of gait and mobility  Muscle weakness (generalized)  ONSET DATE: 2023  SUBJECTIVE:                                                                                                                                                                                           SUBJECTIVE STATEMENT: Pt states she performed the home exercises and made a lot of progress.  Pt went out of town for 2 weeks and has not performed the exercises in the past month except for the past few days.  She likes some of them, and doesn't like the other ones.  Pt denies pain currently.  Pt hikes 5x/week.  Pt states her balance is better.    PERTINENT HISTORY:   History of vertigo 25 years ago; LLD with R LE shorter after  fracture- 0.5" deficit; plantar neuropathy  PAIN:  Are you having pain? No   PRECAUTIONS: None  WEIGHT BEARING RESTRICTIONS No  FALLS:  Has patient fallen in last 6 months? Yes, 1x on hiking trail when tripping over a root  LIVING ENVIRONMENT: Lives with: lives with their family and  lives with their spouse Lives in: House/apartment Stairs: Yes Has following equipment at home: None  OCCUPATION: retired  PLOF: Independent  PATIENT GOALS : improve posture and improve balance and feelings of LOB during hiking   OBJECTIVE:   TODAY'S TREATMENT   Reviewed HEP.   Tandem walking at rail x 3 laps Toe walking and heel walking at rail x 1 lap each Step taps 20x Runner's step up 2x10 Heel taps on 4 inch step Star tap/reach 2x6 Standing row green TB 2x10 Marching on airex 2x12 Sit to stand x 10 reps   PATIENT EDUCATION:  Education details: exercise form, HEP, POC.  Instructed pt to be compliant with HEP.  Person educated: Patient Education method: Explanation, Demonstration, Tactile cues, Verbal cues, and Handouts Education comprehension: verbalized understanding, returned demonstration, verbal cues required, and tactile cues required   HOME EXERCISE PROGRAM: Access Code: [email or web address] URL: [email or web address] Date: 07/10/2022 Prepared by: Daleen Bo  Exercises - Standing Shoulder External Rotation with Resistance  - 1-2 x daily - 7 x weekly - 2 sets - 10 reps - Seated Thoracic Lumbar Extension  - 1-2 x daily - 7 x weekly - 1 sets - 10 reps - 3 hold - Wall Angels  - 1-2 x daily - 7 x weekly - 1 sets - 15 reps - 2 hold - Sit to Stand with Resistance Around Legs  - 1-2 x daily - 7 x weekly - 2 sets - 10 reps - Tandem Walking with Counter Support  - 1-2 x daily - 7 x weekly - 1 sets - 3 reps - 10f hold - Side Stepping with Resistance at Thighs  - 1-2 x daily - 7 x weekly - 1 sets - 3 reps - 242fhold   ASSESSMENT:  CLINICAL IMPRESSION: Pt reports improved  balance overall.  She was performing HEP initially though has stopped over the past month and has just returned to doing them.  PT reviewed HEP and instructed pt in correct form and positioning with exercises.  She requires much instruction for correct form with Heel taps on 4 inch step.  Overall pt performed exercises well though did require cuing for correct form and positioning.  Pt responded well to Rx having no c/o's and no pain after Rx.  PT discussed with pt POC and she may be ready to discharge after next visit.    OBJECTIVE IMPAIRMENTS Abnormal gait, decreased activity tolerance, decreased balance, decreased mobility, difficulty walking, decreased ROM, decreased strength, hypomobility, impaired sensation, improper body mechanics, and postural dysfunction.   ACTIVITY LIMITATIONS standing, squatting, stairs, transfers, reach over head, and locomotion level  PARTICIPATION LIMITATIONS: community activity, yard work, and exercise  PERSONAL FACTORS Age, Fitness, Time since onset of injury/illness/exacerbation, and 1 comorbidity:    are also affecting patient's functional outcome.   REHAB POTENTIAL: Good  CLINICAL DECISION MAKING: Stable/uncomplicated  EVALUATION COMPLEXITY: Low   GOALS:   SHORT TERM GOALS: Target date: 11/12/2022  Pt will become independent with HEP in order to demonstrate synthesis of PT education.  Goal status: met  2.  Pt will be able to demonstrate ability to perform T/S ext into chair without pain in order to demonstrate functional improvement in postural tolerance and ROM.  Goal status: met  LONG TERM GOALS: Target date: 12/24/2022  Pt  will become independent with final HEP in order to demonstrate synthesis of PT education.   Goal status: ongoing  2.  Pt will be able to demonstrate/report ability to walk/hike >1 mile  without report of LOB in order to demonstrate functional improvement and tolerance to exercise and dynamic tasks.  Goal status: met  3.   Pt will demonstrate at least a improvement to at least 80% on ABC scale in order to demonstrate a clinically significant change to cut off score for balance in older adults.   Goal status: ongoing  4. Pt will be able to perform 5XSTS in under 12s  in order to demonstrate functional improvement above the cut off score for adults.  Goal status: ongoing  PLAN: PT FREQUENCY: every other week  PT DURATION: 12 weeks (likely D/C in 6 wks)  PLANNED INTERVENTIONS: Therapeutic exercises, Therapeutic activity, Neuromuscular re-education, Balance training, Gait training, Patient/Family education, Self Care, Joint mobilization, Joint manipulation, Stair training, Vestibular training, DME instructions, Aquatic Therapy, Dry Needling, Electrical stimulation, Spinal manipulation, Spinal mobilization, Cryotherapy, Moist heat, Taping, Vasopneumatic device, Biofeedback, Manual therapy, and Re-evaluation.  PLAN FOR NEXT SESSION: progress LE strength and dynamic balance.  Assess goals.  Possible discharge next Rx.    Selinda Michaels III PT, DPT 10/01/22 5:27 PM

## 2022-10-01 ENCOUNTER — Ambulatory Visit (HOSPITAL_BASED_OUTPATIENT_CLINIC_OR_DEPARTMENT_OTHER): Payer: Medicare HMO | Attending: Internal Medicine | Admitting: Physical Therapy

## 2022-10-01 ENCOUNTER — Encounter (HOSPITAL_BASED_OUTPATIENT_CLINIC_OR_DEPARTMENT_OTHER): Payer: Self-pay | Admitting: Physical Therapy

## 2022-10-01 DIAGNOSIS — M6281 Muscle weakness (generalized): Secondary | ICD-10-CM | POA: Insufficient documentation

## 2022-10-01 DIAGNOSIS — R2689 Other abnormalities of gait and mobility: Secondary | ICD-10-CM | POA: Diagnosis not present

## 2022-10-10 ENCOUNTER — Ambulatory Visit (HOSPITAL_BASED_OUTPATIENT_CLINIC_OR_DEPARTMENT_OTHER): Payer: Medicare HMO | Attending: Internal Medicine | Admitting: Physical Therapy

## 2022-10-10 ENCOUNTER — Encounter (HOSPITAL_BASED_OUTPATIENT_CLINIC_OR_DEPARTMENT_OTHER): Payer: Self-pay | Admitting: Physical Therapy

## 2022-10-10 DIAGNOSIS — R2689 Other abnormalities of gait and mobility: Secondary | ICD-10-CM | POA: Insufficient documentation

## 2022-10-10 DIAGNOSIS — M6281 Muscle weakness (generalized): Secondary | ICD-10-CM | POA: Insufficient documentation

## 2022-10-10 NOTE — Therapy (Signed)
OUTPATIENT PHYSICAL THERAPY TREATMENT  PHYSICAL THERAPY DISCHARGE SUMMARY  Visits from Start of Care: 4  Plan: Patient agrees to discharge.  Patient goals were met. Patient is being discharged due to meeting the stated rehab goals.         Patient Name: Tracy Mendez MRN: [id number] DOB:1950-11-24, 72 y.o., female Today's Date: 10/10/2022   PT End of Session - 10/10/22 1518     Visit Number 4    Number of Visits 4    Date for PT Re-Evaluation 10/08/22    Authorization Type [employer] MCR    PT Start Time 8338    PT Stop Time 1539    PT Time Calculation (min) 24 min    Activity Tolerance Patient tolerated treatment well    Behavior During Therapy [facility] for tasks assessed/performed               Past Medical History:  Diagnosis Date   Depression    Migraine    Past Surgical History:  Procedure Laterality Date   BREAST SURGERY     augmentation 1983   broken leg     1970 right leg surgery   CARPAL TUNNEL RELEASE     right and left wrist   CHOLECYSTECTOMY     1984   FRACTURE SURGERY     nose job     2012   SHOULDER SURGERY     right 2000   Patient Active Problem List   Diagnosis Date Noted   Plantar fasciitis, left 10/20/2019    PCP: Josetta Huddle, MD  REFERRING PROVIDER: Josetta Huddle, MD  REFERRING DIAG: R26.89 (ICD-10-CM) - Other abnormalities of gait and mobility  Rationale for Evaluation and Treatment Rehabilitation  THERAPY DIAG:  Other abnormalities of gait and mobility  Muscle weakness (generalized)  ONSET DATE: 2023  SUBJECTIVE:                                                                                                                                                                                           SUBJECTIVE STATEMENT: Pt states her balance continues to improve greatly. She feels she is ready for D/C at today's session. She continues to do the HEP and is hiking as she would like. Did have 1 fall in Nov that was due to a  vine entangling her foot while she was walking through leaves on a trail. She did not feel that was related to her balance.    PERTINENT HISTORY:   History of vertigo 25 years ago; LLD with R LE shorter after fracture- 0.5" deficit; plantar neuropathy  PAIN:  Are you having pain? No  PRECAUTIONS: None  WEIGHT BEARING RESTRICTIONS No  FALLS:  Has patient fallen in last 6 months? Yes, 1x on hiking trail when tripping over a root  LIVING ENVIRONMENT: Lives with: lives with their family and lives with their spouse Lives in: House/apartment Stairs: Yes Has following equipment at home: None  OCCUPATION: retired  PLOF: Independent  PATIENT GOALS : improve posture and improve balance and feelings of LOB during hiking   OBJECTIVE:      PATIENT SURVEYS:  Total ABC score: 1520 / 1600 = 95.0 %   LOWER EXTREMITY ROM:   WFL throughout bilat LE   LOWER EXTREMITY MMT:     MMT Right 1/4 Left 1/4  Hip flexion 4+/5 4+/5  Hip extension 5/5 5/5  Hip abduction 5/5 5/5  Hip adduction 5/5 5/5  Hip internal rotation      Hip external rotation      Knee flexion 5/5 5/5  Knee extension 5/5 4+/5   (Blank rows = not tested)     FUNCTIONAL TESTS:  5 times sit to stand: 8s Tandem walking: able to perform without LOB 30s 3x SLS: R >10s, L <10s    TODAY'S TREATMENT  Access Code: [email or web address] URL: [email or web address] Date: 10/10/2022 Prepared by: Daleen Bo  Exercises - Seated Thoracic Lumbar Extension  - 1-2 x daily - 7 x weekly - 1 sets - 10 reps - 3 hold - Wall Angels  - 1-2 x daily - 7 x weekly - 1 sets - 15 reps - 2 hold - Standing Shoulder Row with Anchored Resistance  - 1 x daily - 3-4 x weekly - 3 sets - 10 reps - Sit to Stand with Resistance Around Legs  - 1-2 x daily - 3-4 x weekly - 2 sets - 10 reps - Runner's Step Up/Down  - 1 x daily - 3-4 x weekly - 3 sets - 8 reps - Lateral Step Down  - 1 x daily - 3-4 x weekly - 4 sets - 6 reps - Single Leg Balance  with Opposite Leg Star Reach  - 1 x daily - 3-4 x weekly - 2 sets - 5 reps  PATIENT EDUCATION:  Education details: exercise tapering, neuropathy effects on balance, balance systems, continuance with strength throughout aging process Person educated: Patient Education method: Explanation, Demonstration, Tactile cues, Verbal cues, and Handouts Education comprehension: verbalized understanding, returned demonstration, verbal cues required, and tactile cues required   HOME EXERCISE PROGRAM: Access Code: [email or web address] URL: [email or web address] Date: 07/10/2022 Prepared by: Daleen Bo  Exercises - Standing Shoulder External Rotation with Resistance  - 1-2 x daily - 7 x weekly - 2 sets - 10 reps - Seated Thoracic Lumbar Extension  - 1-2 x daily - 7 x weekly - 1 sets - 10 reps - 3 hold - Wall Angels  - 1-2 x daily - 7 x weekly - 1 sets - 15 reps - 2 hold - Sit to Stand with Resistance Around Legs  - 1-2 x daily - 7 x weekly - 2 sets - 10 reps - Tandem Walking with Counter Support  - 1-2 x daily - 7 x weekly - 1 sets - 3 reps - 72f hold - Side Stepping with Resistance at Thighs  - 1-2 x daily - 7 x weekly - 1 sets - 3 reps - 259fhold   ASSESSMENT:  CLINICAL IMPRESSION: Patient has met all functional goals with physical therapy at this time and feels ready for discharge.  Patient feels extremely confident with  all balance activity especially with her continued hiking.  Patient given thorough education on continuation of exercise likely to be beneficial for her moving forward in the future. However, also education was given on tapering of exercise should she choose to do so.  Patient has improved all objective measures significantly at this time no further need for physical therapy plan for DC.  OBJECTIVE IMPAIRMENTS Abnormal gait, decreased activity tolerance, decreased balance, decreased mobility, difficulty walking, decreased ROM, decreased strength, hypomobility, impaired sensation,  improper body mechanics, and postural dysfunction.   ACTIVITY LIMITATIONS standing, squatting, stairs, transfers, reach over head, and locomotion level  PARTICIPATION LIMITATIONS: community activity, yard work, and exercise  PERSONAL FACTORS Age, Fitness, Time since onset of injury/illness/exacerbation, and 1 comorbidity:    are also affecting patient's functional outcome.   REHAB POTENTIAL: Good  CLINICAL DECISION MAKING: Stable/uncomplicated  EVALUATION COMPLEXITY: Low   GOALS:   SHORT TERM GOALS: Target date: 11/21/2022  Pt will become independent with HEP in order to demonstrate synthesis of PT education.  Goal status: met  2.  Pt will be able to demonstrate ability to perform T/S ext into chair without pain in order to demonstrate functional improvement in postural tolerance and ROM.  Goal status: met  LONG TERM GOALS: Target date: 01/02/2023  Pt  will become independent with final HEP in order to demonstrate synthesis of PT education.   Goal status: met  2.  Pt will be able to demonstrate/report ability to walk/hike >1 mile without report of LOB in order to demonstrate functional improvement and tolerance to exercise and dynamic tasks.  Goal status: met  3.  Pt will demonstrate at least a improvement to at least 80% on ABC scale in order to demonstrate a clinically significant change to cut off score for balance in older adults.   Goal status: met  4. Pt will be able to perform 5XSTS in under 12s  in order to demonstrate functional improvement above the cut off score for adults.  Goal status: met  PLAN: PT FREQUENCY: every other week  PT DURATION: 12 weeks (likely D/C in 6 wks)  PLANNED INTERVENTIONS: Therapeutic exercises, Therapeutic activity, Neuromuscular re-education, Balance training, Gait training, Patient/Family education, Self Care, Joint mobilization, Joint manipulation, Stair training, Vestibular training, DME instructions, Aquatic Therapy, Dry  Needling, Electrical stimulation, Spinal manipulation, Spinal mobilization, Cryotherapy, Moist heat, Taping, Vasopneumatic device, Biofeedback, Manual therapy, and Re-evaluation.   Daleen Bo PT, DPT 10/10/22 3:46 PM

## 2022-11-10 DIAGNOSIS — S8992XA Unspecified injury of left lower leg, initial encounter: Secondary | ICD-10-CM | POA: Diagnosis not present

## 2022-11-10 DIAGNOSIS — W1830XA Fall on same level, unspecified, initial encounter: Secondary | ICD-10-CM | POA: Diagnosis not present

## 2022-11-10 DIAGNOSIS — M25562 Pain in left knee: Secondary | ICD-10-CM | POA: Diagnosis not present

## 2022-11-12 ENCOUNTER — Ambulatory Visit: Payer: Medicare HMO | Admitting: Sports Medicine

## 2022-11-12 VITALS — BP 126/82 | Ht 68.0 in | Wt 145.0 lb

## 2022-11-12 DIAGNOSIS — M25562 Pain in left knee: Secondary | ICD-10-CM | POA: Insufficient documentation

## 2022-11-12 NOTE — Progress Notes (Signed)
  Tracy Mendez - 72 y.o. female MRN [id number]  Date of birth: 24-Nov-1950  SUBJECTIVE:  Including CC & ROS.  CC: Left knee injury  HPI: Patient tripped and fell on a hiking trip Saturday 2/3. Thinks she may have landed on her knee, but unsure of exact mechanics. She presented to urgent care the next day, was given NSAIDs, crutches, and advised to use knee sleeve/brace. XR at urgent care did not show an effusion.  She had some chronic changes as discussed below. Since then, patient's knee has improved with less swelling and no longer needing supports to walk. Prior to this she has never had knee issues.   Wearing a Tracy Mendez brace   HISTORY: Past Medical, Surgical, Social, and Family History Reviewed & Updated per EMR.   Pertinent Historical Findings include: none  DATA REVIEWED: 11/10/22 XR L Knee:  1.  No definite fracture or malalignment. 2.  Mild irregularity of the medial tibial plateau/medial tibial eminence, favored positional/reflective of chronic degenerative changes. In the absence of lipohemarthrosis/joint effusion, acute nondisplaced tibial plateau fracture is considered less likely. 3.  Mild tricompartmental degenerative changes. 4.  Patellar enthesopathy. 5.  Osteopenia.     PHYSICAL EXAM:  VS: BP:126/82  HR: bpm  TEMP: ( )  RESP:   HT:'5\' 8"'$  (172.7 cm)   WT:145 lb (65.8 kg)  BMI:22.05 PHYSICAL EXAM: R Knee: - Inspection: mild swelling and warmth inferior to patella. Skin intact - Palpation: TTP over patella. No TTP on medial or lateral joint lines.  - ROM: full active ROM with flexion and extension in knee and hip, though limited by pain - Strength: 4/5 strength on knee ext/flex.  - Neuro/vasc: NV intact - Special Tests: - LIGAMENTS: negative anterior and posterior drawer, no MCL or LCL laxity -- PF JOINT: Reduced patellar mobility. Negative patellar apprehension  Hips: 3/5 strength on hip abduction on left. Negative FABER and FADIR bilaterally  Walking she does  a trendelenburg to shift weight to RT leg  ASSESSMENT & PLAN: See problem based charting & AVS for pt instructions.  L Knee injury: Likely patellar contusion from blunt trauma. Continues to favor L leg on walking. Weakness with hip muscles noted, home exercises provided. No concern for knee instability or ligamentous damage. Despite arthritic changes on x-ray, no clinical symptoms of arthritis currently or prior to her fall. Recommended continued rest, ice, compression, NSAIDs. Provided note for travel/walking restrictions. Follow up in 2 weeks or as needed. Suspect this will primarily take 2 to 3 weeks of rest but will rechk at 4 weeks if not healed.   Estevan Oaks, MS4 [employer] [employer]  I observed and examined the patient with the Medical student and agree with assessment and plan.  Note reviewed and modified by me. Ila Mcgill, MD

## 2022-11-12 NOTE — Assessment & Plan Note (Signed)
We will continue using her knee support Lots of icing Rest Work to improve her walking to be more neutral  If this resolves over 2 weeks she can resume her more normal walking schedule otherwise we will recheck

## 2022-12-17 ENCOUNTER — Ambulatory Visit: Payer: Medicare HMO | Admitting: Sports Medicine

## 2023-01-20 ENCOUNTER — Encounter: Payer: Self-pay | Admitting: *Deleted

## 2023-01-20 DIAGNOSIS — G43109 Migraine with aura, not intractable, without status migrainosus: Secondary | ICD-10-CM | POA: Diagnosis not present

## 2023-01-20 DIAGNOSIS — R7303 Prediabetes: Secondary | ICD-10-CM | POA: Diagnosis not present

## 2023-01-20 DIAGNOSIS — E538 Deficiency of other specified B group vitamins: Secondary | ICD-10-CM | POA: Diagnosis not present

## 2023-01-20 DIAGNOSIS — E785 Hyperlipidemia, unspecified: Secondary | ICD-10-CM | POA: Diagnosis not present

## 2023-01-20 DIAGNOSIS — G629 Polyneuropathy, unspecified: Secondary | ICD-10-CM | POA: Diagnosis not present

## 2023-01-20 DIAGNOSIS — Z0000 Encounter for general adult medical examination without abnormal findings: Secondary | ICD-10-CM | POA: Diagnosis not present

## 2023-01-20 DIAGNOSIS — E559 Vitamin D deficiency, unspecified: Secondary | ICD-10-CM | POA: Diagnosis not present

## 2023-01-20 DIAGNOSIS — M8589 Other specified disorders of bone density and structure, multiple sites: Secondary | ICD-10-CM | POA: Diagnosis not present

## 2023-01-20 DIAGNOSIS — Z23 Encounter for immunization: Secondary | ICD-10-CM | POA: Diagnosis not present

## 2023-01-20 DIAGNOSIS — Z9181 History of falling: Secondary | ICD-10-CM | POA: Diagnosis not present

## 2023-07-21 DIAGNOSIS — H52223 Regular astigmatism, bilateral: Secondary | ICD-10-CM | POA: Diagnosis not present

## 2023-08-13 DIAGNOSIS — L578 Other skin changes due to chronic exposure to nonionizing radiation: Secondary | ICD-10-CM | POA: Diagnosis not present

## 2023-08-13 DIAGNOSIS — D2230 Melanocytic nevi of unspecified part of face: Secondary | ICD-10-CM | POA: Diagnosis not present

## 2023-08-13 DIAGNOSIS — D225 Melanocytic nevi of trunk: Secondary | ICD-10-CM | POA: Diagnosis not present

## 2023-08-13 DIAGNOSIS — L219 Seborrheic dermatitis, unspecified: Secondary | ICD-10-CM | POA: Diagnosis not present

## 2023-08-13 DIAGNOSIS — L239 Allergic contact dermatitis, unspecified cause: Secondary | ICD-10-CM | POA: Diagnosis not present

## 2023-08-13 DIAGNOSIS — L570 Actinic keratosis: Secondary | ICD-10-CM | POA: Diagnosis not present

## 2023-08-13 DIAGNOSIS — L821 Other seborrheic keratosis: Secondary | ICD-10-CM | POA: Diagnosis not present

## 2023-08-13 DIAGNOSIS — Z85828 Personal history of other malignant neoplasm of skin: Secondary | ICD-10-CM | POA: Diagnosis not present

## 2023-08-15 DIAGNOSIS — H608X3 Other otitis externa, bilateral: Secondary | ICD-10-CM | POA: Diagnosis not present

## 2023-08-15 DIAGNOSIS — H903 Sensorineural hearing loss, bilateral: Secondary | ICD-10-CM | POA: Diagnosis not present

## 2023-08-15 DIAGNOSIS — Z9889 Other specified postprocedural states: Secondary | ICD-10-CM | POA: Diagnosis not present

## 2023-08-18 DIAGNOSIS — Z1231 Encounter for screening mammogram for malignant neoplasm of breast: Secondary | ICD-10-CM | POA: Diagnosis not present

## 2023-11-14 ENCOUNTER — Other Ambulatory Visit: Payer: Self-pay | Admitting: Internal Medicine

## 2023-11-14 DIAGNOSIS — R1110 Vomiting, unspecified: Secondary | ICD-10-CM

## 2023-11-14 DIAGNOSIS — R12 Heartburn: Secondary | ICD-10-CM | POA: Diagnosis not present

## 2023-12-11 ENCOUNTER — Ambulatory Visit
Admission: RE | Admit: 2023-12-11 | Discharge: 2023-12-11 | Disposition: A | Discharge status: Home / Self Care | Payer: Medicare HMO | Source: Ambulatory Visit | Attending: Internal Medicine | Admitting: Internal Medicine

## 2023-12-11 DIAGNOSIS — R1110 Vomiting, unspecified: Secondary | ICD-10-CM | POA: Diagnosis not present

## 2023-12-11 DIAGNOSIS — K219 Gastro-esophageal reflux disease without esophagitis: Secondary | ICD-10-CM | POA: Diagnosis not present

## 2023-12-11 DIAGNOSIS — K5641 Fecal impaction: Secondary | ICD-10-CM | POA: Diagnosis not present

## 2023-12-11 MED ORDER — IOPAMIDOL (ISOVUE-300) INJECTION 61%
100.0000 mL | Freq: Once | INTRAVENOUS | Status: AC | PRN
  Administered 2023-12-11: 100 mL via INTRAVENOUS

## 2023-12-12 DIAGNOSIS — M9904 Segmental and somatic dysfunction of sacral region: Secondary | ICD-10-CM | POA: Diagnosis not present

## 2023-12-12 DIAGNOSIS — M9902 Segmental and somatic dysfunction of thoracic region: Secondary | ICD-10-CM | POA: Diagnosis not present

## 2023-12-12 DIAGNOSIS — M9901 Segmental and somatic dysfunction of cervical region: Secondary | ICD-10-CM | POA: Diagnosis not present

## 2023-12-12 DIAGNOSIS — M9903 Segmental and somatic dysfunction of lumbar region: Secondary | ICD-10-CM | POA: Diagnosis not present

## 2023-12-18 DIAGNOSIS — K5909 Other constipation: Secondary | ICD-10-CM | POA: Diagnosis not present

## 2023-12-18 DIAGNOSIS — R1110 Vomiting, unspecified: Secondary | ICD-10-CM | POA: Diagnosis not present

## 2023-12-26 DIAGNOSIS — M9904 Segmental and somatic dysfunction of sacral region: Secondary | ICD-10-CM | POA: Diagnosis not present

## 2023-12-26 DIAGNOSIS — M9903 Segmental and somatic dysfunction of lumbar region: Secondary | ICD-10-CM | POA: Diagnosis not present

## 2023-12-26 DIAGNOSIS — M9902 Segmental and somatic dysfunction of thoracic region: Secondary | ICD-10-CM | POA: Diagnosis not present

## 2023-12-26 DIAGNOSIS — M9901 Segmental and somatic dysfunction of cervical region: Secondary | ICD-10-CM | POA: Diagnosis not present

## 2024-02-16 DIAGNOSIS — M8588 Other specified disorders of bone density and structure, other site: Secondary | ICD-10-CM | POA: Diagnosis not present

## 2024-02-17 ENCOUNTER — Other Ambulatory Visit (HOSPITAL_BASED_OUTPATIENT_CLINIC_OR_DEPARTMENT_OTHER): Payer: Self-pay | Admitting: Internal Medicine

## 2024-02-17 DIAGNOSIS — E785 Hyperlipidemia, unspecified: Secondary | ICD-10-CM

## 2024-03-17 ENCOUNTER — Ambulatory Visit (HOSPITAL_BASED_OUTPATIENT_CLINIC_OR_DEPARTMENT_OTHER)
Admission: RE | Admit: 2024-03-17 | Discharge: 2024-03-17 | Disposition: A | Discharge status: Home / Self Care | Payer: Self-pay | Source: Ambulatory Visit | Attending: Internal Medicine | Admitting: Internal Medicine

## 2024-03-17 DIAGNOSIS — E785 Hyperlipidemia, unspecified: Secondary | ICD-10-CM | POA: Insufficient documentation

## 2024-07-21 DIAGNOSIS — H52223 Regular astigmatism, bilateral: Secondary | ICD-10-CM | POA: Diagnosis not present

## 2024-08-19 DIAGNOSIS — D2230 Melanocytic nevi of unspecified part of face: Secondary | ICD-10-CM | POA: Diagnosis not present

## 2024-08-19 DIAGNOSIS — L309 Dermatitis, unspecified: Secondary | ICD-10-CM | POA: Diagnosis not present

## 2024-08-19 DIAGNOSIS — L570 Actinic keratosis: Secondary | ICD-10-CM | POA: Diagnosis not present

## 2024-08-19 DIAGNOSIS — L578 Other skin changes due to chronic exposure to nonionizing radiation: Secondary | ICD-10-CM | POA: Diagnosis not present

## 2024-08-19 DIAGNOSIS — B351 Tinea unguium: Secondary | ICD-10-CM | POA: Diagnosis not present

## 2024-08-19 DIAGNOSIS — D225 Melanocytic nevi of trunk: Secondary | ICD-10-CM | POA: Diagnosis not present

## 2024-08-19 DIAGNOSIS — L821 Other seborrheic keratosis: Secondary | ICD-10-CM | POA: Diagnosis not present

## 2024-08-19 DIAGNOSIS — Z85828 Personal history of other malignant neoplasm of skin: Secondary | ICD-10-CM | POA: Diagnosis not present

## 2024-08-23 DIAGNOSIS — Z1231 Encounter for screening mammogram for malignant neoplasm of breast: Secondary | ICD-10-CM | POA: Diagnosis not present
